# Patient Record
Sex: Male | Born: 1965 | Race: White | Hispanic: No | Marital: Married | Smoking: Current every day smoker
Health system: Southern US, Community
[De-identification: ages and names within clinical notes are randomized; demographics above are authoritative.]

## PROBLEM LIST (undated history)

## (undated) DIAGNOSIS — J439 Emphysema, unspecified: Secondary | ICD-10-CM

## (undated) DIAGNOSIS — K219 Gastro-esophageal reflux disease without esophagitis: Secondary | ICD-10-CM

## (undated) DIAGNOSIS — G44009 Cluster headache syndrome, unspecified, not intractable: Secondary | ICD-10-CM

## (undated) DIAGNOSIS — I10 Essential (primary) hypertension: Secondary | ICD-10-CM

## (undated) DIAGNOSIS — G2581 Restless legs syndrome: Secondary | ICD-10-CM

## (undated) DIAGNOSIS — F419 Anxiety disorder, unspecified: Secondary | ICD-10-CM

## (undated) DIAGNOSIS — M1990 Unspecified osteoarthritis, unspecified site: Secondary | ICD-10-CM

## (undated) DIAGNOSIS — J449 Chronic obstructive pulmonary disease, unspecified: Secondary | ICD-10-CM

## (undated) DIAGNOSIS — Z860100 Personal history of colon polyps, unspecified: Secondary | ICD-10-CM

## (undated) DIAGNOSIS — G479 Sleep disorder, unspecified: Secondary | ICD-10-CM

## (undated) DIAGNOSIS — H02409 Unspecified ptosis of unspecified eyelid: Secondary | ICD-10-CM

## (undated) DIAGNOSIS — E785 Hyperlipidemia, unspecified: Secondary | ICD-10-CM

## (undated) HISTORY — PX: TRIGGER FINGER RELEASE: SHX641

## (undated) HISTORY — DX: Personal history of colon polyps, unspecified: Z86.0100

## (undated) HISTORY — DX: Anxiety disorder, unspecified: F41.9

## (undated) HISTORY — DX: Essential (primary) hypertension: I10

## (undated) HISTORY — PX: POLYPECTOMY: SHX149

## (undated) HISTORY — DX: Gastro-esophageal reflux disease without esophagitis: K21.9

## (undated) HISTORY — DX: Emphysema, unspecified: J43.9

## (undated) HISTORY — DX: Personal history of colonic polyps: Z86.010

## (undated) HISTORY — PX: COLONOSCOPY: SHX174

## (undated) HISTORY — DX: Hyperlipidemia, unspecified: E78.5

## (undated) HISTORY — DX: Sleep disorder, unspecified: G47.9

## (undated) HISTORY — PX: KNEE ARTHROSCOPY: SUR90

## (undated) HISTORY — DX: Restless legs syndrome: G25.81

## (undated) HISTORY — DX: Chronic obstructive pulmonary disease, unspecified: J44.9

## (undated) HISTORY — DX: Unspecified ptosis of unspecified eyelid: H02.409

## (undated) HISTORY — DX: Unspecified osteoarthritis, unspecified site: M19.90

## (undated) HISTORY — DX: Cluster headache syndrome, unspecified, not intractable: G44.009

---
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
## 2000-06-04 ENCOUNTER — Emergency Department (HOSPITAL_COMMUNITY): Admission: EM | Admit: 2000-06-04 | Discharge: 2000-06-05 | Payer: Self-pay | Admitting: Emergency Medicine

## 2000-06-05 ENCOUNTER — Encounter: Payer: Self-pay | Admitting: Emergency Medicine

## 2003-04-16 ENCOUNTER — Emergency Department (HOSPITAL_COMMUNITY): Admission: EM | Admit: 2003-04-16 | Discharge: 2003-04-16 | Payer: Self-pay

## 2004-02-14 ENCOUNTER — Ambulatory Visit: Payer: Self-pay | Admitting: Internal Medicine

## 2004-02-14 ENCOUNTER — Inpatient Hospital Stay (HOSPITAL_COMMUNITY): Admission: EM | Admit: 2004-02-14 | Discharge: 2004-02-15 | Payer: Self-pay | Admitting: Emergency Medicine

## 2004-02-15 ENCOUNTER — Ambulatory Visit: Payer: Self-pay | Admitting: Internal Medicine

## 2005-06-11 ENCOUNTER — Emergency Department (HOSPITAL_COMMUNITY): Admission: EM | Admit: 2005-06-11 | Discharge: 2005-06-11 | Payer: Self-pay | Admitting: Emergency Medicine

## 2005-08-05 ENCOUNTER — Ambulatory Visit: Payer: Self-pay

## 2005-08-05 ENCOUNTER — Ambulatory Visit: Payer: Self-pay | Admitting: Cardiovascular Disease

## 2005-08-07 ENCOUNTER — Ambulatory Visit: Payer: Self-pay | Admitting: Cardiovascular Disease

## 2005-08-07 ENCOUNTER — Ambulatory Visit (HOSPITAL_COMMUNITY): Admission: RE | Admit: 2005-08-07 | Discharge: 2005-08-07 | Payer: Self-pay | Admitting: Cardiovascular Disease

## 2006-02-11 ENCOUNTER — Emergency Department (HOSPITAL_COMMUNITY): Admission: EM | Admit: 2006-02-11 | Discharge: 2006-02-11 | Payer: Self-pay | Admitting: Emergency Medicine

## 2006-07-17 ENCOUNTER — Emergency Department (HOSPITAL_COMMUNITY): Admission: EM | Admit: 2006-07-17 | Discharge: 2006-07-18 | Payer: Self-pay | Admitting: Emergency Medicine

## 2006-12-29 ENCOUNTER — Emergency Department (HOSPITAL_COMMUNITY): Admission: EM | Admit: 2006-12-29 | Discharge: 2006-12-29 | Payer: Self-pay | Admitting: Emergency Medicine

## 2007-02-18 ENCOUNTER — Encounter: Admission: RE | Admit: 2007-02-18 | Discharge: 2007-02-18 | Payer: Self-pay | Admitting: Family Medicine

## 2007-03-16 ENCOUNTER — Inpatient Hospital Stay (HOSPITAL_COMMUNITY): Admission: RE | Admit: 2007-03-16 | Discharge: 2007-03-18 | Payer: Self-pay | Admitting: Orthopaedic Surgery

## 2007-06-07 ENCOUNTER — Ambulatory Visit: Payer: Self-pay | Admitting: Family Medicine

## 2007-06-08 ENCOUNTER — Ambulatory Visit: Payer: Self-pay | Admitting: Internal Medicine

## 2007-06-10 ENCOUNTER — Ambulatory Visit: Payer: Self-pay | Admitting: Internal Medicine

## 2007-06-10 ENCOUNTER — Encounter: Payer: Self-pay | Admitting: Internal Medicine

## 2007-11-19 ENCOUNTER — Emergency Department (HOSPITAL_COMMUNITY): Admission: EM | Admit: 2007-11-19 | Discharge: 2007-11-19 | Payer: Self-pay | Admitting: Emergency Medicine

## 2008-12-21 ENCOUNTER — Emergency Department (HOSPITAL_COMMUNITY): Admission: EM | Admit: 2008-12-21 | Discharge: 2008-12-21 | Payer: Self-pay | Admitting: Emergency Medicine

## 2009-05-21 ENCOUNTER — Emergency Department (HOSPITAL_COMMUNITY): Admission: EM | Admit: 2009-05-21 | Discharge: 2009-05-21 | Payer: Self-pay | Admitting: Emergency Medicine

## 2009-11-22 ENCOUNTER — Telehealth (INDEPENDENT_AMBULATORY_CARE_PROVIDER_SITE_OTHER): Payer: Self-pay | Admitting: *Deleted

## 2009-11-27 ENCOUNTER — Ambulatory Visit: Payer: Self-pay | Admitting: Internal Medicine

## 2009-11-27 DIAGNOSIS — G2581 Restless legs syndrome: Secondary | ICD-10-CM

## 2009-11-27 DIAGNOSIS — G4733 Obstructive sleep apnea (adult) (pediatric): Secondary | ICD-10-CM

## 2009-11-27 DIAGNOSIS — R109 Unspecified abdominal pain: Secondary | ICD-10-CM | POA: Insufficient documentation

## 2009-11-27 DIAGNOSIS — I10 Essential (primary) hypertension: Secondary | ICD-10-CM | POA: Insufficient documentation

## 2009-11-27 DIAGNOSIS — J4489 Other specified chronic obstructive pulmonary disease: Secondary | ICD-10-CM | POA: Insufficient documentation

## 2009-11-27 DIAGNOSIS — Z86010 Personal history of colonic polyps: Secondary | ICD-10-CM

## 2009-11-27 DIAGNOSIS — J449 Chronic obstructive pulmonary disease, unspecified: Secondary | ICD-10-CM

## 2009-12-03 LAB — CONVERTED CEMR LAB
ALT: 26 units/L (ref 0–53)
AST: 18 units/L (ref 0–37)
Albumin: 4.3 g/dL (ref 3.5–5.2)
Alkaline Phosphatase: 80 units/L (ref 39–117)
BUN: 29 mg/dL — ABNORMAL HIGH (ref 6–23)
Basophils Relative: 0.8 % (ref 0.0–3.0)
Bilirubin, Direct: 0.1 mg/dL (ref 0.0–0.3)
CO2: 28 meq/L (ref 19–32)
Calcium: 9.7 mg/dL (ref 8.4–10.5)
Chloride: 98 meq/L (ref 96–112)
Creatinine, Ser: 1.3 mg/dL (ref 0.4–1.5)
Eosinophils Absolute: 0.2 10*3/uL (ref 0.0–0.7)
Eosinophils Relative: 2.1 % (ref 0.0–5.0)
GFR calc non Af Amer: 62.13 mL/min (ref >60)
H Pylori IgG: POSITIVE
HCT: 42.3 % (ref 39.0–52.0)
Hemoglobin: 14.7 g/dL (ref 13.0–17.0)
Lipase: 30 units/L (ref 11.0–59.0)
Lymphocytes Relative: 23.3 % (ref 12.0–46.0)
Lymphs Abs: 2.6 10*3/uL (ref 0.7–4.0)
MCV: 99.2 fL (ref 78.0–100.0)
Monocytes Absolute: 0.9 10*3/uL (ref 0.1–1.0)
Monocytes Relative: 8.3 % (ref 3.0–12.0)
Neutro Abs: 7.2 10*3/uL (ref 1.4–7.7)
Neutrophils Relative %: 65.5 % (ref 43.0–77.0)
Platelets: 291 10*3/uL (ref 150.0–400.0)
RBC: 4.27 M/uL (ref 4.22–5.81)
RDW: 13.2 % (ref 11.5–14.6)
Sodium: 137 meq/L (ref 135–145)
Total Protein: 7.3 g/dL (ref 6.0–8.3)
WBC: 11 10*3/uL — ABNORMAL HIGH (ref 4.5–10.5)

## 2009-12-13 ENCOUNTER — Ambulatory Visit: Payer: Self-pay | Admitting: Internal Medicine

## 2009-12-13 DIAGNOSIS — T7840XA Allergy, unspecified, initial encounter: Secondary | ICD-10-CM | POA: Insufficient documentation

## 2009-12-14 ENCOUNTER — Telehealth: Payer: Self-pay | Admitting: Internal Medicine

## 2009-12-21 ENCOUNTER — Telehealth (INDEPENDENT_AMBULATORY_CARE_PROVIDER_SITE_OTHER): Payer: Self-pay | Admitting: *Deleted

## 2009-12-31 ENCOUNTER — Telehealth (INDEPENDENT_AMBULATORY_CARE_PROVIDER_SITE_OTHER): Payer: Self-pay | Admitting: *Deleted

## 2010-01-01 ENCOUNTER — Telehealth (INDEPENDENT_AMBULATORY_CARE_PROVIDER_SITE_OTHER): Payer: Self-pay | Admitting: *Deleted

## 2010-04-17 ENCOUNTER — Ambulatory Visit
Admission: RE | Admit: 2010-04-17 | Discharge: 2010-04-17 | Payer: Self-pay | Source: Home / Self Care | Attending: Internal Medicine | Admitting: Internal Medicine

## 2010-04-17 ENCOUNTER — Encounter: Payer: Self-pay | Admitting: Internal Medicine

## 2010-04-17 DIAGNOSIS — J45909 Unspecified asthma, uncomplicated: Secondary | ICD-10-CM | POA: Insufficient documentation

## 2010-04-17 DIAGNOSIS — L509 Urticaria, unspecified: Secondary | ICD-10-CM | POA: Insufficient documentation

## 2010-04-17 DIAGNOSIS — M25529 Pain in unspecified elbow: Secondary | ICD-10-CM | POA: Insufficient documentation

## 2010-05-05 ENCOUNTER — Encounter: Payer: Self-pay | Admitting: *Deleted

## 2010-05-14 NOTE — Progress Notes (Signed)
Summary: QUESTION ABOUT APPT THIS AFTERNOON  Phone Note Call from Patient Call back at Work Phone [phone]   Caller: Patient Summary of Call: PATIENT HAS APPT LATE THIS AFTERNOON WITH DR HOPPER--HE SAYS THAT PREDNISONE IS HELPING WITH THE ITCHING AND SWELLING SYMPTOMS---HE DOES NOT HAVE ANY BREATHING PROBLEMS WITH THIS RASH  HE DOESNT KNOW WHETHER TO KEEP THIS APPOINTMENT TODAY OR NOT---PLEASE CALL [phone] Initial call taken by: Jerolyn Shin,  January 01, 2010 10:18 AM  Follow-up for Phone Call        called patient informed he still needs to keep office visit w/ hop...Marland KitchenMarland KitchenDoristine Devoid CMA  January 01, 2010 11:07 AM

## 2010-05-14 NOTE — Progress Notes (Signed)
Summary: allergic reaction  Phone Note Call from Patient Call back at Home Phone [phone]   Caller: Patient Summary of Call: spoke w/ patient says he is having an allergic reaction again says it got better after being seen on 12/13/09 and recently was doing some work outside this weekend and laid rag over face to shield from sunlight and then around lunch time his eyes where swollen and now has hives over body has been taken benadryl w/ some relief would like to have prescription for prednisone to get started on and also has appt scheduled to come in tomorrow afternoon.  hop pls advise.  [facility]  Initial call taken by: Doristine Devoid CMA,  December 31, 2009 10:10 AM  Follow-up for Phone Call        prednisone 20 mg two times a day #10. Also take Zantac 75 OTC 2 two times a day  Follow-up by: Marga Melnick MD,  December 31, 2009 1:16 PM  Additional Follow-up for Phone Call Additional follow up Details #1::        spoke w/ patient aware prescription sent to pharmacy and keep appt tomorrow.......Marland KitchenDoristine Devoid CMA  December 31, 2009 4:37 PM     New/Updated Medications: PREDNISONE 20 MG TABS (PREDNISONE) take two times a day Prescriptions: PREDNISONE 20 MG TABS (PREDNISONE) take two times a day  #10 x 0   Entered by:   Doristine Devoid CMA   Authorized by:   Marga Melnick MD   Signed by:   Doristine Devoid CMA on 12/31/2009   Method used:   Electronically to        [facility] (retail)       [address]       [place], [place]  [address]       Ph: [phone]       Fax: [phone]   RxID:   [id number]

## 2010-05-14 NOTE — Progress Notes (Signed)
Summary: Pt status (lmom 9/2, 9/6, 9/7)  ---- Converted from flag ---- ---- 12/13/2009 5:16 PM, Jose E. Paz MD wrote: please check on the patient ------------------------------  Left message for pt to call back.  Army Fossa CMA  December 14, 2009 1:34 PM  lmtcb.Marland KitchenMarland KitchenHarold Barban  December 18, 2009 11:41 AM   lmtcb.Marland KitchenMarland KitchenHarold Barban  December 19, 2009 10:20 AM    Unable to reach pt. Army Fossa CMA  December 21, 2009 9:04 AM

## 2010-05-14 NOTE — Progress Notes (Signed)
Summary: 9-9pt status  Phone Note Call from Patient Call back at Work Phone [phone]   Caller: Patient Summary of Call: pt still c/o itching at night on feet and hands. pt states that swelling and hive have resolved. pt denies any redness, fever or any other symptoms.  Pt uses [facility]...............Marland KitchenFelecia Deloach CMA  December 21, 2009 12:30 PM   Follow-up for Phone Call        should gradually resolve. No further prescriptions  at this time. Follow-up by: Nolon Rod. Paz MD,  December 21, 2009 1:44 PM  Additional Follow-up for Phone Call Additional follow up Details #1::        left message to call office................Marland KitchenFelecia Deloach CMA  December 21, 2009 2:42 PM

## 2010-05-14 NOTE — Progress Notes (Signed)
Summary: NEW PT  Phone Note Call from Patient   Caller: Spouse Summary of Call: WIFE CALLS AND STATES THAT [name] IS AN OLD PT OF DR. PAZ LAST OV WAS IN 2005. [name] IS A PT OF DR. HOPPER AND Peter Vang WOULD LIKE TO BE A NEW PT OF DR. HOPPER. PLEASE ADVISE. CAN BE CONTACTED AT [phone] Initial call taken by: Lavell Islam,  November 22, 2009 12:16 PM  Follow-up for Phone Call        Yes we can see any new patient that is 64 and under.  Follow-up by: Shonna Chock CMA,  November 22, 2009 1:15 PM  Additional Follow-up for Phone Call Additional follow up Details #1::        pt has an appt Tuesday 8/16 Additional Follow-up by: Lavell Islam,  November 22, 2009 3:02 PM
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
## 2010-05-16 NOTE — Assessment & Plan Note (Signed)
Summary: POSSIBLE INFECTION/DEBRIS OF ELBOW/KB   Vital Signs:  Patient profile:   45 year old male Weight:      205 pounds BMI:     29.52 Temp:     98.6 degrees F oral Pulse rate:   80 / minute Resp:     15 per minute BP sitting:   124 / 86  (left arm) Cuff size:   large  Vitals Entered By: Shonna Chock CMA (April 17, 2010 10:48 AM) CC: 1.) Patient with reoccuring rash x few months ( no active rash today), patient also with right elbow infection-patient thinks he may have gotten a peice of metal in arm at work. 2.) Discuss increase in strength for Alprazolanm, Lower Extremity Joint pain, Rash   CC:  1.) Patient with reoccuring rash x few months ( no active rash today), patient also with right elbow infection-patient thinks he may have gotten a peice of metal in arm at work. 2.) Discuss increase in strength for Alprazolanm, Lower Extremity Joint pain, and Rash.  History of Present Illness:      This is a 46 year old man who presents with Upper  Extremity Joint pain intermittently for 3 months   The patient denies swelling, redness, stiffness for >1 hr, decreased ROM, and weakness.  The pain is located in the R elbow.  The pain began suddenly and with no injury. He questions relationship to possible  foreign body such as metal shaving. The pain is described as sharp , non radiating  and positionally  related.It occurs intermittently with pressure on  elbow.  No evaluation to date  The patient denies the following symptoms: fever, rash, photosensitivity, eye symptoms, diarrhea, and dysuria.        The patient also presents with   recurrent rash.  The patient reports welts and itching, but denies pustules, blisters, and ulcers.  The rash is located on the upper posterior trunk & limbs diffusely.  The rash is worse with scratching.  Associated symptoms include facial swelling on 1 occasion only & of hands and feet another occasion. Rx: Benadryl helps. Note: he is on ACE-I .The patient denies the  following symptoms: tongue swelling, difficulty breathing, abdominal pain, nausea, vomiting, diarrhea, and arthralgias.    Allergies (verified): No Known Drug Allergies  Physical Exam  General:  in no acute distress; alert,appropriate and cooperative throughout examination Mouth:  Oral mucosa and oropharynx without lesions or exudates.  Teeth in good repair. Moderate pharyngeal erythema.   Lungs:  Normal respiratory effort, chest expands symmetrically. Lungs : scattered rhonchi  Heart:  Normal rate and regular rhythm. S1 and S2 normal without gallop, murmur, click, rub.S4 Abdomen:  Bowel sounds positive,abdomen soft and non-tender without masses, organomegaly or hernias noted. Pulses:  R and L radial pulses are full and equal bilaterally Extremities:  No clubbing, cyanosis, edema, or deformity noted with normal full range of motion of all joints.  Minor R  elbow tenosynovitis  findings with pronation. No pain even with hard percussion  Neurologic:  alert & oriented X3, strength normal in all extremities, and DTRs symmetrical and normal.   Skin:  Intact without suspicious lesions or rashes. Lipoma R forearm. Mild Dermatographia Cervical Nodes:  No lymphadenopathy noted Axillary Nodes:  No palpable lymphadenopathy; no epitrochlear LA   Impression & Recommendations:  Problem # 1:  ELBOW PAIN, RIGHT (ICD-719.42)  probabale localized nerve impingement   Orders: T-Elbow Rigt 2 View (73070TC)  Problem # 2:  URTICARIA (ICD-708.9)  Problem # 3:  REACTIVE AIRWAY DISEASE (ICD-493.90)  The following medications were removed from the medication list:    Prednisone 20 Mg Tabs (Prednisone) .Marland Kitchen... Take two times a day His updated medication list for this problem includes:    Proair Hfa 108 (90 Base) Mcg/act Aers (Albuterol sulfate) .Marland Kitchen... 1-2 puffs every 4 hours as needed  Complete Medication List: 1)  Hydrochlorothiazide 25 Mg Tabs (Hydrochlorothiazide) .Marland Kitchen.. 1 tablet once daily 2)  Ibuprofen  200 Mg Tabs (Ibuprofen) .... 600 mg daily, as needed 3)  Alprazolam 1 Mg Tabs (alprazolam)  .Marland Kitchen.. 1 at bedtime  as needed 4)  Ranitidine Hcl 150 Mg Tabs (Ranitidine hcl) .Marland Kitchen.. 1 two times a day pre meals 5)  Hydroxyzine Pamoate 25 Mg Caps (Hydroxyzine pamoate) .Marland Kitchen.. 1 every 4-6 hrs as needed for rash 6)  Amlodipine Besylate 5 Mg Tabs (Amlodipine besylate) .Marland Kitchen.. 1 once daily for bp 7)  Proair Hfa 108 (90 Base) Mcg/act Aers (Albuterol sulfate) .Marland Kitchen.. 1-2 puffs every 4 hours as needed  Other Orders: Tdap => 13yrs IM (16109) Admin 1st Vaccine (60454)  Patient Instructions: 1)  Go to Web MD for Hives (Urticaria) . 2)  Stop Smoking Tips: Choose a Quit date. Cut down before the Quit date. decide what you will do as a substitute when you feel the urge to smoke(gum,toothpick,exercise). Prescriptions: ALPRAZOLAM 1  MG TABS (ALPRAZOLAM) 1 at bedtime  as needed  #30 x 2   Entered and Authorized by:   Marga Melnick MD   Signed by:   Shonna Chock CMA on 04/17/2010   Method used:   Print then Give to Patient   RxID:   [id number] AMLODIPINE BESYLATE 5 MG TABS (AMLODIPINE BESYLATE) 1 once daily for BP  #30 x 5   Entered and Authorized by:   Marga Melnick MD   Signed by:   Marga Melnick MD on 04/17/2010   Method used:   Print then Give to Patient   RxID:   [id number] HYDROXYZINE PAMOATE 25 MG CAPS (HYDROXYZINE PAMOATE) 1 every 4-6 hrs as needed for rash  #30 x 1   Entered and Authorized by:   Marga Melnick MD   Signed by:   Marga Melnick MD on 04/17/2010   Method used:   Print then Give to Patient   RxID:   [id number] RANITIDINE HCL 150 MG TABS (RANITIDINE HCL) 1 two times a day pre meals  #60 x 2   Entered and Authorized by:   Marga Melnick MD   Signed by:   Marga Melnick MD on 04/17/2010   Method used:   Print then Give to Patient   RxID:   [id number]    Orders Added: 1)  Est. Patient Level IV [64332] 2)  T-Elbow Rigt 2 View [73070TC] 3)  Tdap => 52yrs IM [90715] 4)   Admin 1st Vaccine [95188]   Immunizations Administered:  Tetanus Vaccine:    Vaccine Type: Tdap    Site: right deltoid    Mfr: GlaxoSmithKline    Dose: 0.5 ml    Route: IM    Given by: Shonna Chock CMA    Exp. Date: 02/01/2012    Lot #: [id number]   Immunizations Administered:  Tetanus Vaccine:    Vaccine Type: Tdap    Site: right deltoid    Mfr: GlaxoSmithKline    Dose: 0.5 ml    Route: IM    Given by: Shonna Chock CMA    Exp. Date: 02/01/2012    Lot #: [id number]

## 2010-05-16 NOTE — Miscellaneous (Signed)
Summary: Flu/[facility]  [facility]   Imported By: Lanelle Bal 04/26/2010 11:09:45  _____________________________________________________________________  External Attachment:    Type:   Image     Comment:   External Document

## 2010-06-22 ENCOUNTER — Encounter: Payer: Self-pay | Admitting: Internal Medicine

## 2010-07-03 LAB — WOUND CULTURE: Culture: NO GROWTH

## 2010-07-17 ENCOUNTER — Other Ambulatory Visit: Payer: Self-pay | Admitting: Internal Medicine

## 2010-07-19 LAB — COMPREHENSIVE METABOLIC PANEL
Alkaline Phosphatase: 113 U/L (ref 39–117)
BUN: 19 mg/dL (ref 6–23)
CO2: 26 mEq/L (ref 19–32)
Chloride: 104 mEq/L (ref 96–112)
Creatinine, Ser: 1.1 mg/dL (ref 0.4–1.5)
GFR calc non Af Amer: >60 mL/min (ref >60)
Potassium: 4 mEq/L (ref 3.5–5.1)
Total Bilirubin: 0.7 mg/dL (ref 0.3–1.2)

## 2010-07-19 LAB — CBC
HCT: 42.7 % (ref 39.0–52.0)
Hemoglobin: 14.9 g/dL (ref 13.0–17.0)
MCV: 94.3 fL (ref 78.0–100.0)
RBC: 4.53 MIL/uL (ref 4.22–5.81)
WBC: 8 10*3/uL (ref 4.0–10.5)

## 2010-07-19 LAB — URINALYSIS, ROUTINE W REFLEX MICROSCOPIC
Glucose, UA: NEGATIVE mg/dL
Hgb urine dipstick: NEGATIVE
Specific Gravity, Urine: 1.024 (ref 1.005–1.030)
pH: 7 (ref 5.0–8.0)

## 2010-07-19 LAB — DIFFERENTIAL
Basophils Absolute: 0 10*3/uL (ref 0.0–0.1)
Basophils Relative: 0 % (ref 0–1)
Eosinophils Relative: 4 % (ref 0–5)
Lymphocytes Relative: 18 % (ref 12–46)
Monocytes Absolute: 0.7 10*3/uL (ref 0.1–1.0)
Neutro Abs: 5.5 10*3/uL (ref 1.7–7.7)
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
## 2010-08-30 NOTE — Discharge Summary (Signed)
NAME:  Peter Vang, Peter Vang NO.:  [id number]   MEDICAL RECORD NO.:  [id number]          PATIENT TYPE:  INP   LOCATION:  3735                         FACILITY:  [facility]   PHYSICIAN:  Titus Dubin. Alwyn Ren, M.D. [facility] OF BIRTH:  10/21/1965   DATE OF ADMISSION:  02/14/2004  DATE OF DISCHARGE:  02/15/2004                                 DISCHARGE SUMMARY   NOTATION:  I received a phone call from Amber on the 3700 floor, phone [phone], requesting a discharge for [name].  He had a nuclear  stress test.  I requested that she contact the cardiology P.A. for the  results.  She called Tereso Newcomer, P.A., who did document that the study was  negative.  By computer, I verified that the patient's cardiac enzymes were  negative.  I called the patient and related this information to him.   DISPOSITION/FOLLOWUP:  He is to be discharged, to follow up with Dr. Fenton Malling in five to seven days.   DISCHARGE INSTRUCTIONS:  The triggers for esophageal reflux were discussed.  These would include the aspirin family, including ibuprofen, Naproxen and  all non-steroidals.  Additionally it was recommended that he avoid alcohol,  peppermint, tobacco and caffeine (coffee, tea, Cola and chocolate).  It  would also be recommended that he not eat within two hours of going to bed.  He is to return to the emergency room should he have progression or  recurrence of symptoms with these interventions.   DISCHARGE MEDICATIONS:  It was recommended that he take Prilosec over-the-  counter before the morning meal.       [facility]/MEDQ  D:  02/15/2004  T:  02/16/2004  Job:  [id number]

## 2010-09-11 ENCOUNTER — Telehealth: Payer: Self-pay | Admitting: Internal Medicine

## 2010-09-11 NOTE — Telephone Encounter (Signed)
Patient wants refill for xanax -he is leaving for the beach Friday [phone] - would like to pick up before he leaves - [facility] - [address]

## 2010-09-12 MED ORDER — ALPRAZOLAM 1 MG PO TABS: ORAL_TABLET | ORAL | Status: DC

## 2010-09-12 NOTE — Telephone Encounter (Signed)
rx faxed to pharmacy

## 2010-10-28 ENCOUNTER — Other Ambulatory Visit: Payer: Self-pay | Admitting: Internal Medicine

## 2010-11-19 ENCOUNTER — Other Ambulatory Visit: Payer: Self-pay | Admitting: Internal Medicine

## 2010-11-19 MED ORDER — ALPRAZOLAM 1 MG PO TABS: ORAL_TABLET | ORAL | Status: DC

## 2010-11-19 MED ORDER — ALBUTEROL SULFATE HFA 108 (90 BASE) MCG/ACT IN AERS: 2.0000 | INHALATION_SPRAY | Freq: Four times a day (QID) | RESPIRATORY_TRACT | Status: DC | PRN

## 2010-11-19 NOTE — Telephone Encounter (Signed)
RX's sent to pharmacy 

## 2010-11-19 NOTE — Telephone Encounter (Signed)
Patient was given  sample of inhaler - he doesn't know the name - he wants rx called in - also refill for xanax - [facility] - [address]

## 2011-01-21 LAB — BASIC METABOLIC PANEL
BUN: 18
Chloride: 103
Creatinine, Ser: 1.32
GFR calc Af Amer: >60
GFR calc non Af Amer: 59 — ABNORMAL LOW

## 2011-01-21 LAB — CBC
MCV: 92.9
Platelets: 258
RBC: 4.34
WBC: 7.9

## 2011-01-23 LAB — POCT CARDIAC MARKERS
CKMB, poc: <1 — ABNORMAL LOW
CKMB, poc: <1 — ABNORMAL LOW
Myoglobin, poc: 41.1
Myoglobin, poc: 52.4
Operator id: 279831
Troponin i, poc: <0.05
Troponin i, poc: <0.05

## 2011-01-23 LAB — I-STAT 8, (EC8 V) (CONVERTED LAB)
Acid-Base Excess: 4 — ABNORMAL HIGH
Bicarbonate: 30.2 — ABNORMAL HIGH
HCT: 47
Operator id: 279831
TCO2: 32
pCO2, Ven: 50.9 — ABNORMAL HIGH
pH, Ven: 7.381 — ABNORMAL HIGH

## 2011-01-23 LAB — POCT I-STAT CREATININE: Operator id: 279831

## 2011-02-13 ENCOUNTER — Ambulatory Visit: Payer: Self-pay | Admitting: Internal Medicine

## 2011-02-14 ENCOUNTER — Telehealth: Payer: Self-pay | Admitting: Internal Medicine

## 2011-02-14 NOTE — Telephone Encounter (Signed)
Patient wants refill for inhaler - he thinks it is albuterol - [facility] [address]

## 2011-02-14 NOTE — Telephone Encounter (Signed)
LMOM to inform patient that he has active refills from 11/19/10 Rx sent to his pharmacy available [5 refills authorized].

## 2011-02-19 ENCOUNTER — Encounter: Payer: Self-pay | Admitting: Internal Medicine

## 2011-02-20 ENCOUNTER — Ambulatory Visit (INDEPENDENT_AMBULATORY_CARE_PROVIDER_SITE_OTHER): Payer: BC Managed Care – PPO | Admitting: Internal Medicine

## 2011-02-20 ENCOUNTER — Other Ambulatory Visit: Payer: Self-pay | Admitting: Internal Medicine

## 2011-02-20 ENCOUNTER — Encounter: Payer: Self-pay | Admitting: Internal Medicine

## 2011-02-20 VITALS — BP 146/98 | HR 92 | Temp 98.4°F | Wt 203.6 lb

## 2011-02-20 DIAGNOSIS — R1310 Dysphagia, unspecified: Secondary | ICD-10-CM

## 2011-02-20 DIAGNOSIS — M2550 Pain in unspecified joint: Secondary | ICD-10-CM

## 2011-02-20 DIAGNOSIS — K219 Gastro-esophageal reflux disease without esophagitis: Secondary | ICD-10-CM

## 2011-02-20 MED ORDER — TRAMADOL HCL 50 MG PO TABS: 50.0000 mg | ORAL_TABLET | Freq: Three times a day (TID) | ORAL | Status: DC | PRN

## 2011-02-20 MED ORDER — ALPRAZOLAM 1 MG PO TABS: ORAL_TABLET | ORAL | Status: DC

## 2011-02-20 MED ORDER — RANITIDINE HCL 150 MG PO TABS: 150.0000 mg | ORAL_TABLET | Freq: Two times a day (BID) | ORAL | Status: DC

## 2011-02-20 NOTE — Patient Instructions (Signed)
The triggers for dyspepsia or "heart burn"  include stress; the "aspirin family" ; alcohol; peppermint; and caffeine (coffee, tea, cola, and chocolate). The aspirin family would include aspirin and the nonsteroidal agents such as ibuprofen &  Naproxen. Tylenol would not cause reflux. If having dyspepsia ; food & drink should be avoided for @ least 2 hours before going to bed. Consider  [facility]'s smoking cessation program @ www.[facility].com or 539-041-0608.   Please think about quitting smoking. Review the risks we discussed. Please call 1-800-QUIT-NOW  for free smoking cessation counseling.

## 2011-02-20 NOTE — Telephone Encounter (Signed)
RX sent

## 2011-02-20 NOTE — Progress Notes (Signed)
  Subjective:    Patient ID: Peter Vang, male    DOB: 1965-09-22, 45 y.o.   MRN: [id number]  HPI Dysphagia: Location: upper chest  Onset: 4-5 months intermittently   Severity: pain up to 7 Quality: sharp  Duration: minutes  Better with: regurgitation  Worse with: ? meat Symptoms Nausea/Vomiting: no  Melena/BRBPR: no  Hematemesis: no   Wt loss: no  EtOH use: yes, minimal beer Caffeine: sweet tea  1/2 gallon / day & 1 cup coffee/ day Smoking : 1-1.5 ppd  NSAIDs/ASA: yes, Ibuprofen 600-800 mg 2-3 X / day for MS pain  Past Surgeries: colonoscopy for ? Pain ; polypectomy FH: negative for GI disease       Review of Systems     Objective:   Physical Exam General appearance is  good  nourishment; no acute distress or increased work of breathing is present.  No  lymphadenopathy about the head, neck, or axilla noted.   Eyes: No conjunctival inflammation or lid edema is present. There is no scleral icterus. Ptosis OS Oral exam: Dental hygiene is good; lips and gums are healthy appearing.There is marked uvular  erythema ; no exudate noted.    Heart:  Normal rate and regular rhythm. S1 and S2 normal without gallop, murmur, click, rub S 4  Lungs:Chest clear to auscultation; no wheezes, rhonchi,rales ,or rubs present.No increased work of breathing.   Abdomen: bowel sounds normal, soft and non-tender without masses, organomegaly or hernias noted.  No guarding or rebound    Extremities:  No cyanosis, edema, or clubbing  noted    Skin: Warm & dry w/o jaundice or tenting.          Assessment & Plan:  #1 dysphasia, recurrent. Pathophysiology discussed  #2 excess triggers of nonsteroidals, tobacco use  Plan: See orders and recommendations

## 2011-02-21 ENCOUNTER — Encounter: Payer: Self-pay | Admitting: Internal Medicine

## 2011-03-01 ENCOUNTER — Ambulatory Visit (INDEPENDENT_AMBULATORY_CARE_PROVIDER_SITE_OTHER): Payer: BC Managed Care – PPO | Admitting: Family Medicine

## 2011-03-01 ENCOUNTER — Encounter: Payer: Self-pay | Admitting: Family Medicine

## 2011-03-01 ENCOUNTER — Ambulatory Visit: Payer: BC Managed Care – PPO | Admitting: Family Medicine

## 2011-03-01 VITALS — BP 112/78 | HR 90 | Temp 98.5°F | Wt 206.8 lb

## 2011-03-01 DIAGNOSIS — J441 Chronic obstructive pulmonary disease with (acute) exacerbation: Secondary | ICD-10-CM | POA: Insufficient documentation

## 2011-03-01 DIAGNOSIS — R062 Wheezing: Secondary | ICD-10-CM

## 2011-03-01 MED ORDER — IPRATROPIUM BROMIDE 0.02 % IN SOLN
0.5000 mg | Freq: Once | RESPIRATORY_TRACT | Status: AC
  Administered 2011-03-01: 0.5 mg via RESPIRATORY_TRACT

## 2011-03-01 MED ORDER — PREDNISONE 50 MG PO TABS: 50.0000 mg | ORAL_TABLET | Freq: Every day | ORAL | Status: DC

## 2011-03-01 MED ORDER — DOXYCYCLINE HYCLATE 100 MG PO CAPS: 100.0000 mg | ORAL_CAPSULE | Freq: Two times a day (BID) | ORAL | Status: AC

## 2011-03-01 MED ORDER — ALBUTEROL SULFATE (2.5 MG/3ML) 0.083% IN NEBU
2.5000 mg | INHALATION_SOLUTION | Freq: Once | RESPIRATORY_TRACT | Status: AC
  Administered 2011-03-01: 2.5 mg via RESPIRATORY_TRACT

## 2011-03-01 MED ORDER — HYDROCODONE-HOMATROPINE 5-1.5 MG/5ML PO SYRP: 5.0000 mL | ORAL_SOLUTION | Freq: Every evening | ORAL | Status: AC | PRN

## 2011-03-01 NOTE — Assessment & Plan Note (Signed)
Treated with alb/atrovent with subjective improvement in breathing and improved air movement. Sent home with prednisone course, doxy, and scheduled albuterol. To return for breathing f/u with PCP ?spirometry discussed importance of smoking cessation. No evidence of PNA today.

## 2011-03-01 NOTE — Progress Notes (Signed)
  Subjective:    Patient ID: Peter Vang, male    DOB: 1965/06/22, 45 y.o.   MRN: [id number]  HPI CC: cold?  45 yo with h/o COPD presents with 1 wk h/o significant congestion, HA, coughing up thick green mucous, body aches.  More SOB than normal.  More coughing than normal.  More productive sputum than normal.  At night not sleeping well.  Wheezing at night.  Has tried mucinex D, tramadol for aches.  Has been on steroids before.  Takes albuterol as needed.  Has had spirometry.  No fevers/chills, abd pain, n/v/d, rashes, appetite ok.  No ear pain or tooth pain.  Significant other sick last week.  Smoker - trying to quit.  Averages 1/2 ppd.  Using patches.  Previously 1-2 ppd.    Today O2 90% RA.  1 1/2 wks ago had O2 at 88%, no sxs or cough.  Review of Systems Per HPI    Objective:   Physical Exam  Vitals reviewed. Constitutional: He appears well-developed and well-nourished. No distress.  HENT:  Head: Normocephalic and atraumatic.  Right Ear: Hearing, tympanic membrane, external ear and ear canal normal.  Left Ear: Hearing, tympanic membrane, external ear and ear canal normal.  Nose: Nose normal. No mucosal edema or rhinorrhea. Right sinus exhibits no maxillary sinus tenderness and no frontal sinus tenderness. Left sinus exhibits no maxillary sinus tenderness and no frontal sinus tenderness.  Mouth/Throat: Uvula is midline and mucous membranes are normal. Posterior oropharyngeal erythema present. No oropharyngeal exudate, posterior oropharyngeal edema or tonsillar abscesses.  Eyes: Conjunctivae and EOM are normal. Pupils are equal, round, and reactive to light. No scleral icterus.  Neck: Normal range of motion. Neck supple. No thyromegaly present.  Cardiovascular: Normal rate, regular rhythm, normal heart sounds and intact distal pulses.   No murmur heard. Pulmonary/Chest: Effort normal. No respiratory distress. He has wheezes. He has no rales.       Coarse throughout RLL  rhonchi Coughing with end exp wheeze  Musculoskeletal: He exhibits no edema.  Lymphadenopathy:    He has no cervical adenopathy.  Skin: Skin is warm and dry. No rash noted.  Psychiatric: He has a normal mood and affect.  after breathing treatment (alb/atrovent), improved air movement.  minimal wheezing.  no rales.   Assessment & Plan:

## 2011-03-01 NOTE — Patient Instructions (Addendum)
I think you have COPD exacerbation. Treat with steroid course and antibiotics. Use albuterol regularly for the next 3-4 days (2 puffs every 4-6 hours) then just as needed. Breathing treatment today. Best thing you can do is quit smoking - good job with cessation up to now. Please return to see Dr. Alwyn Ren in 3-4 weeks for breathing follow up, sooner if not improving as expected. If worsening - fever >101.5, worsening cough, shortness of breath, please seek urgent care.

## 2011-03-03 ENCOUNTER — Ambulatory Visit: Payer: BC Managed Care – PPO | Admitting: Internal Medicine

## 2011-03-19 ENCOUNTER — Ambulatory Visit: Payer: BC Managed Care – PPO | Admitting: Internal Medicine

## 2011-04-04 ENCOUNTER — Ambulatory Visit (INDEPENDENT_AMBULATORY_CARE_PROVIDER_SITE_OTHER): Payer: BC Managed Care – PPO | Admitting: Internal Medicine

## 2011-04-04 ENCOUNTER — Other Ambulatory Visit: Payer: Self-pay | Admitting: Internal Medicine

## 2011-04-04 ENCOUNTER — Encounter: Payer: Self-pay | Admitting: Internal Medicine

## 2011-04-04 VITALS — BP 132/64 | HR 92 | Ht 68.0 in | Wt 202.0 lb

## 2011-04-04 DIAGNOSIS — Z86010 Personal history of colonic polyps: Secondary | ICD-10-CM

## 2011-04-04 DIAGNOSIS — R1310 Dysphagia, unspecified: Secondary | ICD-10-CM

## 2011-04-04 DIAGNOSIS — R042 Hemoptysis: Secondary | ICD-10-CM

## 2011-04-04 DIAGNOSIS — R1314 Dysphagia, pharyngoesophageal phase: Secondary | ICD-10-CM

## 2011-04-04 DIAGNOSIS — G479 Sleep disorder, unspecified: Secondary | ICD-10-CM

## 2011-04-04 MED ORDER — ESOMEPRAZOLE MAGNESIUM 40 MG PO CPDR: 40.0000 mg | DELAYED_RELEASE_CAPSULE | Freq: Every day | ORAL | Status: DC
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
## 2011-04-04 NOTE — Patient Instructions (Addendum)
You have been scheduled for an Endoscopy with separate instructions given. Please schedule an appointment with [name] at [phone], about your sleep problems. Your Ranitidine has bee discontinued. Start Nexium 40 mg 1 capsule every morning 30 minutes before breakfast. You have been given a GERD Diet to read and follow. Please reduce your Caffeine intake and try to lose 10 lbs.

## 2011-04-09 ENCOUNTER — Encounter: Payer: Self-pay | Admitting: Internal Medicine

## 2011-04-09 ENCOUNTER — Other Ambulatory Visit: Payer: Self-pay | Admitting: Internal Medicine

## 2011-04-16 ENCOUNTER — Other Ambulatory Visit: Payer: Self-pay | Admitting: Internal Medicine

## 2011-04-17 ENCOUNTER — Other Ambulatory Visit: Payer: Self-pay | Admitting: Internal Medicine

## 2011-04-17 NOTE — Telephone Encounter (Signed)
RX sent

## 2011-04-17 NOTE — Telephone Encounter (Signed)
Please advise 

## 2011-04-17 NOTE — Telephone Encounter (Signed)
OK X1 

## 2011-04-17 NOTE — Telephone Encounter (Signed)
Pt is calling about refill. Wants this done today because he is completely out and this is the only thing that helps him sleep.

## 2011-04-18 ENCOUNTER — Telehealth: Payer: Self-pay | Admitting: Internal Medicine

## 2011-04-18 NOTE — Telephone Encounter (Signed)
No charge. 

## 2011-04-18 NOTE — Telephone Encounter (Signed)
Rx sent 

## 2011-04-18 NOTE — Telephone Encounter (Signed)
Per office note patient was to return to primary care MD for work up.  Dr Leone Payor did tell him to call and schedule an appt for sleep apnea.  I have asked the wife to schedule him an appt with his primary care MD.

## 2011-04-21 ENCOUNTER — Encounter: Payer: BC Managed Care – PPO | Admitting: Internal Medicine

## 2011-04-22 ENCOUNTER — Telehealth: Payer: Self-pay | Admitting: *Deleted

## 2011-04-22 NOTE — Telephone Encounter (Signed)
Called and spoke with pt's wife.  Wife scheduled pt for a sleep consult on 05/12/11 at 11:30

## 2011-04-22 NOTE — Telephone Encounter (Signed)
Message copied by Salli Quarry on Tue Apr 22, 2011 12:12 PM ------      Message from: [place], Maree Krabbe      Created: Fri Apr 04, 2011  6:12 PM       Aundra Millet, received a note from [employer].  He asked pt to call and set up apptm for me to discuss sleep issues with him.  I think I only see him for copd.  If that is the case, make sure this is a consult and in slot.  Thanks.

## 2011-05-12 ENCOUNTER — Ambulatory Visit (AMBULATORY_SURGERY_CENTER): Payer: BC Managed Care – PPO | Admitting: Internal Medicine

## 2011-05-12 ENCOUNTER — Encounter: Payer: Self-pay | Admitting: Pulmonary Disease

## 2011-05-12 ENCOUNTER — Encounter: Payer: Self-pay | Admitting: Internal Medicine

## 2011-05-12 ENCOUNTER — Ambulatory Visit (INDEPENDENT_AMBULATORY_CARE_PROVIDER_SITE_OTHER): Payer: BC Managed Care – PPO | Admitting: Pulmonary Disease

## 2011-05-12 VITALS — BP 140/92 | HR 88 | Temp 98.5°F | Ht 68.0 in | Wt 194.6 lb

## 2011-05-12 VITALS — BP 132/95 | HR 81 | Temp 96.1°F | Resp 18 | Ht 68.0 in | Wt 202.0 lb

## 2011-05-12 DIAGNOSIS — R1314 Dysphagia, pharyngoesophageal phase: Secondary | ICD-10-CM

## 2011-05-12 DIAGNOSIS — K219 Gastro-esophageal reflux disease without esophagitis: Secondary | ICD-10-CM

## 2011-05-12 DIAGNOSIS — G4700 Insomnia, unspecified: Secondary | ICD-10-CM

## 2011-05-12 DIAGNOSIS — R042 Hemoptysis: Secondary | ICD-10-CM

## 2011-05-12 MED ORDER — OMEPRAZOLE 20 MG PO CPDR: 20.0000 mg | DELAYED_RELEASE_CAPSULE | Freq: Every day | ORAL | Status: DC

## 2011-05-12 MED ORDER — ROPINIROLE HCL 0.5 MG PO TABS: ORAL_TABLET | ORAL | Status: DC

## 2011-05-12 MED ORDER — SODIUM CHLORIDE 0.9 % IV SOLN: 500.0000 mL | INTRAVENOUS | Status: DC

## 2011-05-12 NOTE — Patient Instructions (Signed)
Will try requip 0.5mg  one after supper each night for 3 nights, then increase to 2 after dinner Take melatonin 3mg  3-4 hrs before bedtime each night. Spend before going to bed doing something that relaxes you No tv or reading in bed, just sleeping If you cannot initiate sleep in , leave bedroom and read or watch tv until you are able to get sleepy again.  No eating or drinking, no computer at night.  Do this as many times as necessary during the night until you fall asleep or time to go to work No caffeine after 10am.  Drink decaffeinated tea. No napping during the day. You may have to have your back evaluated further if chronic pain continues.  This can be a big issue with sleep disruption. Please call me in 3 weeks with how things are going.

## 2011-05-12 NOTE — Op Note (Addendum)
[facility] [address] [place], [place]  [address]  ENDOSCOPY PROCEDURE REPORT  PATIENT:  Peter, Vang  MR#:  [id number] BIRTHDATE:  1965-07-03, 45 yrs. old  GENDER:  male  ENDOSCOPIST:  Iva Boop, MD, [facility]  PROCEDURE DATE:  05/12/2011 PROCEDURE:  EGD, diagnostic 43235, Maloney Dilation of Esophagus ASA CLASS:  Class II INDICATIONS:  dysphagia 1+ years of intermittent solid food dysphagia  MEDICATIONS:   These medications were titrated to patient response per physician's verbal order, Benadryl 12.5 mg IV, Versed 8 mg IV, Fentanyl 75 mcg IV TOPICAL ANESTHETIC:  Cetacaine Spray  DESCRIPTION OF PROCEDURE:   After the risks benefits and alternatives of the procedure were thoroughly explained, informed consent was obtained.  The LB GIF-H180 [id number] endoscope was introduced through the mouth and advanced to the second portion of the duodenum, without limitations.  The instrument was slowly withdrawn as the mucosa was fully examined. <<PROCEDUREIMAGES>>  An erosion was found in the distal esophagus. Few mm area of erythema consistent with a healing erosion. Z-line below and distinct at 40 cm.  Otherwise the examination was normal. Retroflexed views revealed no abnormalities.    The scope was then withdrawn from the patient, a 29 [place] Maloney dilator passed with mild-moderate resistance and no heme,  and the procedure completed.  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 REVISED:  05/12/2011 03:30 PM eSIGNED:   Iva Boop at 05/12/2011 03:30 PM  Gerome Sam, [phone]

## 2011-05-12 NOTE — Progress Notes (Signed)
Subjective:    Patient ID: Peter Vang, male    DOB: 01-15-1966, 46 y.o.   MRN: [id number]  HPI The patient is a 46 year old male who I've been asked to see for long-standing sleeping issues.  The patient states that he has had a problem with sleep onset and maintenance for at least 10 years, and is getting worse.  He typically will go to bed between 10 and 11 at night, and will usually watch television and had before trying to sleep.  He states that he may go to sleep within one hour, but usually it is 2 or more hours.  He has even stayed up all night without sleeping.  He typically will toss and turn, and states that he is unable to "wind down".  He will sometimes get up and go to the family room, and sometimes will get on a computer.  He is a minimal snorer according to the wife, and does not have any abnormal breathing pattern during sleep.  The patient does describe severe pain in his lower extremities like cramping, and has very severe leg kicking during the night according to the wife.  The leg discomfort does not occur during the day, and really does not start until he lies down in bed.  It does not get better with movement such as walking.  The patient also has severe chronic low back pain, and it is very painful at night.  He feels this is an issue with his sleep as well.  He has never been tried on a medication for possible restless legs syndrome.  The patient will sometimes take Xanax at bedtime, but has been treated with other sleeping medications in the past that actually makes his leg discomfort worse.  He gets up at 5:30 AM to start his day, and does not feel rested.  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  His wife has not noted sleepiness watching television in the evening, and the patient denies any sleepiness with driving.  His weight has actually gone down over the last few years.  The patient  drinks one cup of coffee in the morning, and then will drink 2-3 glasses of caffeinated tea a day.  He also will drink a few alcoholic beverages a few days a week.  He denies napping during the day.  His Epworth score today is only 2.   Review of Systems  Constitutional: Positive for appetite change. Negative for fever and unexpected weight change.  HENT: Positive for sore throat, trouble swallowing and dental problem. Negative for ear pain, nosebleeds, congestion, rhinorrhea, sneezing, postnasal drip and sinus pressure.   Eyes: Negative for redness and itching.  Respiratory: Positive for cough and shortness of breath. Negative for chest tightness and wheezing.   Cardiovascular: Positive for chest pain. Negative for palpitations and leg swelling.  Gastrointestinal: Negative for nausea and vomiting.  Genitourinary: Negative for dysuria.  Musculoskeletal: Positive for joint swelling.  Skin: Negative for rash.  Neurological: Negative for headaches.  Hematological: Does not bruise/bleed easily.  Psychiatric/Behavioral: Negative for dysphoric mood. The patient is nervous/anxious.        Objective:   Physical Exam Constitutional:  Well developed, no acute distress  HENT:  Nares patent without discharge  Oropharynx without exudate, palate and uvula are normal  Eyes:  Perrla, eomi, no scleral icterus  Neck:  No JVD, no TMG  Cardiovascular:  Normal rate, regular rhythm, no rubs or gallops.  No murmurs        Intact distal pulses  Pulmonary :  Normal breath sounds, no stridor or respiratory distress   No rales, rhonchi, or wheezing  Abdominal:  Soft, nondistended, bowel sounds present.  No tenderness noted.   Musculoskeletal:  No lower extremity edema noted.  Lymph Nodes:  No cervical lymphadenopathy noted  Skin:  No cyanosis noted  Neurologic:  Appears sleepy, appropriate, moves all 4 extremities without obvious deficit.         Assessment & Plan:

## 2011-05-12 NOTE — Assessment & Plan Note (Signed)
The patient has significant issues with sleep onset and maintenance for many years, and feels that it is worsening.  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  I questioned whether his leg issues are really related to RLS, since a lot of his history does not fit with this disorder.  He does have a lot of chronic back pain, and when combined with his leg symptoms, I wonder if this is more neuropathic pain.  I will try him on a dopamine agonist to see if he has a response, but if he does not, I would recommend a more aggressive evaluation of his back and leg issues.  I have also reviewed with him various environmental factors that can disrupt sleep, and asked him to assess this in his own sleeping environment.  In terms of treatment for his insomnia, I have explained that medications are rarely the answer.  I have reviewed good sleep hygiene with him, along with stimulus control therapy to see if this will help.  Ultimately, he may need a referral to behavioral specialist to consider cognitive behavioral therapy.

## 2011-05-12 NOTE — Patient Instructions (Addendum)
There was one spot of inflammation in the esophagus - thought due to acid reflux. The exam was otherwise ok. I dilated the esophagus because of your history of swallowing problems. Please follow the special diet today and tomorrow (in other instructions). I have prescribed omeprazole (generic Prilosec) to be taken every day as I think you have GERD and believe this will prevent further swallowing problems.  Please also try to quit smoking. It increases GERD and other problems. One of your biggest health concerns is your smoking.  This increases your risk for most cancers and serious cardiovascular diseases such as strokes, heart attacks.  You should try your best to stop.  If you need assistance, please contact your PCP or Smoking Cessation Class at [facility] [phone]) or [facility] Quit-Line (1-800-QUIT-NOW).  Iva Boop, MD, [facility]

## 2011-05-13 ENCOUNTER — Telehealth: Payer: Self-pay

## 2011-05-13 NOTE — Telephone Encounter (Signed)
  Follow up Call-  Call back number 05/12/2011  Post procedure Call Back phone  # [phone]  Permission to leave phone message Yes     Patient questions:  Do you have a fever, pain , or abdominal swelling? no Pain Score  0 *  Have you tolerated food without any problems? yes  Have you been able to return to your normal activities? yes  Do you have any questions about your discharge instructions: Diet   no Medications  no Follow up visit  no  Do you have questions or concerns about your Care? no  Actions: * If pain score is 4 or above: No action needed, pain <4.

## 2011-05-23 ENCOUNTER — Other Ambulatory Visit: Payer: Self-pay | Admitting: Internal Medicine

## 2011-06-11 ENCOUNTER — Other Ambulatory Visit: Payer: Self-pay | Admitting: Pulmonary Disease

## 2011-06-13 ENCOUNTER — Other Ambulatory Visit: Payer: Self-pay | Admitting: Pulmonary Disease

## 2011-06-16 ENCOUNTER — Telehealth: Payer: Self-pay | Admitting: Pulmonary Disease

## 2011-06-16 NOTE — Telephone Encounter (Signed)
LMOM for pt's wife TCB.  Pt was last seen by [facility] on 05/12/11 and tried on Requip 0.5mg  and was to call in 3 weeks to give [place] an update which he never did.  Need to know how he is doing on the requip.

## 2011-06-17 MED ORDER — ROPINIROLE HCL 0.5 MG PO TABS: ORAL_TABLET | ORAL | Status: DC

## 2011-06-17 NOTE — Telephone Encounter (Signed)
Spouse returned call. [phone] (work) . Me

## 2011-06-17 NOTE — Telephone Encounter (Signed)
PT's spouse reports that pt's leg cramps were much better on Requip.  He has been out of it for 2 days and leg cramps have returned.  Pt has not seen much difference in sleep pattern though.  Still only averaging 4 hrs/night.  Wakes up around 3-4 am and can't go back to sleep.  Pt is taking 2 Requip after dinner and also taking Melatonin as directed by Dr Shelle Iron.  Is it ok to refill Requip?  Please advise.

## 2011-06-17 NOTE — Telephone Encounter (Signed)
Called, spoke with pt's wife, Foye Clock.  I informed her of below per [facility].  She verbalized understanding of this and aware requip rx will be sent to [facility] on [address].  They will call back if anything further is needed prior to f/u.

## 2011-06-17 NOTE — Telephone Encounter (Signed)
Ok to refill with 11 fills. He needs to followup with me in one year if his leg jerks are going well. Regarding his awakening at night, he needs to remember to not stay in bed if he cannot get back to sleep within .  Can return to the bedroom once he gets sleepy enough to try again.

## 2011-06-17 NOTE — Telephone Encounter (Signed)
Spouse called back again. "hoping" that dr clance will get this rx in today asap. She hates to see her husband having so much leg pain. Also wanted to know if this rx could be written for a longer time so that they don't have to keep requesting this.

## 2011-06-18 ENCOUNTER — Telehealth: Payer: Self-pay | Admitting: Internal Medicine

## 2011-06-18 MED ORDER — ALBUTEROL SULFATE HFA 108 (90 BASE) MCG/ACT IN AERS: 2.0000 | INHALATION_SPRAY | Freq: Four times a day (QID) | RESPIRATORY_TRACT | Status: DC | PRN

## 2011-06-18 NOTE — Telephone Encounter (Signed)
Prescription sent to pharmacy.

## 2011-06-18 NOTE — Telephone Encounter (Signed)
Patient called stating he needs Dr. Alwyn Ren to call him in the albuterol inhaler he has used in the past. Patient states he does not call often and he needs this taken care of immediately. [facility] on [address].

## 2011-07-09 ENCOUNTER — Other Ambulatory Visit: Payer: Self-pay | Admitting: Internal Medicine

## 2011-09-02 ENCOUNTER — Other Ambulatory Visit: Payer: Self-pay | Admitting: Internal Medicine

## 2011-09-02 NOTE — Telephone Encounter (Signed)
Rx sent 

## 2011-10-27 ENCOUNTER — Other Ambulatory Visit: Payer: Self-pay | Admitting: Internal Medicine

## 2011-12-23 ENCOUNTER — Other Ambulatory Visit: Payer: Self-pay | Admitting: Internal Medicine

## 2011-12-24 NOTE — Telephone Encounter (Signed)
Last OV 02-20-11, last filled 10-27-11 #30 1

## 2011-12-24 NOTE — Telephone Encounter (Signed)
OK X 1; no additional refills w/o OV . Last OV 11/12

## 2011-12-26 ENCOUNTER — Other Ambulatory Visit: Payer: Self-pay | Admitting: Internal Medicine

## 2011-12-26 MED ORDER — ALPRAZOLAM 1 MG PO TABS: ORAL_TABLET | ORAL | Status: DC

## 2011-12-26 NOTE — Telephone Encounter (Signed)
RX called in .

## 2011-12-26 NOTE — Telephone Encounter (Signed)
Pt states that sleep med is not at pharm. He is very concern that he gets this presc before weekend, amym

## 2012-01-02 ENCOUNTER — Other Ambulatory Visit: Payer: Self-pay | Admitting: Internal Medicine

## 2012-01-26 ENCOUNTER — Other Ambulatory Visit: Payer: Self-pay | Admitting: Internal Medicine

## 2012-01-26 NOTE — Telephone Encounter (Signed)
Patient will need to schedule a CPX prior to additional refills

## 2012-02-24 ENCOUNTER — Other Ambulatory Visit: Payer: Self-pay | Admitting: Internal Medicine

## 2012-02-24 NOTE — Telephone Encounter (Signed)
OV 02/20/11, labs 11/27/09. Last filled 01/26/12 # 30 no refills  Plz advise   MW  [place] plz schedule pt appt     MW

## 2012-02-24 NOTE — Telephone Encounter (Signed)
Called pt to scheduled cpe-pt scheduled for 1.31.14 1:30pm  NOTE pt stated he needs his medicine called in today he has been getting this for 30-years and he can not sleep without it. Patient was screaming at me and I advised him I would scheduled the CPE and let [place] know he has made his appt but ultimately it is up to the dr., to approve RX but someone would call him back to let him know if medsd have been sent, please call on cell as home# was disconnected

## 2012-02-24 NOTE — Telephone Encounter (Signed)
OK # 30 qd prn

## 2012-02-24 NOTE — Telephone Encounter (Signed)
RX called in. Patient aware.  

## 2012-02-25 NOTE — Telephone Encounter (Signed)
Rx request refused already done.      MW

## 2012-03-29 ENCOUNTER — Other Ambulatory Visit: Payer: Self-pay | Admitting: Internal Medicine

## 2012-03-30 ENCOUNTER — Other Ambulatory Visit: Payer: Self-pay | Admitting: Internal Medicine

## 2012-04-28 ENCOUNTER — Other Ambulatory Visit: Payer: Self-pay | Admitting: Internal Medicine

## 2012-04-29 NOTE — Telephone Encounter (Signed)
Patient aware Controlled Substance Contract to be sign and rx to be picked up   

## 2012-05-14 ENCOUNTER — Encounter: Payer: BC Managed Care – PPO | Admitting: Internal Medicine

## 2012-05-28 ENCOUNTER — Other Ambulatory Visit: Payer: Self-pay | Admitting: Internal Medicine

## 2012-05-31 ENCOUNTER — Other Ambulatory Visit: Payer: Self-pay | Admitting: Internal Medicine

## 2012-05-31 MED ORDER — AMLODIPINE BESYLATE 5 MG PO TABS: 5.0000 mg | ORAL_TABLET | Freq: Every day | ORAL | Status: DC

## 2012-05-31 NOTE — Telephone Encounter (Signed)
Last OV 02-20-11, last 02-26-13  #30

## 2012-05-31 NOTE — Telephone Encounter (Signed)
Rx sent 

## 2012-05-31 NOTE — Telephone Encounter (Signed)
OK X1 but additional refills require office visit to update medical history.Last seen 2012

## 2012-06-10 ENCOUNTER — Encounter: Payer: Self-pay | Admitting: Internal Medicine

## 2012-06-25 ENCOUNTER — Other Ambulatory Visit: Payer: Self-pay | Admitting: Internal Medicine

## 2012-06-25 NOTE — Telephone Encounter (Signed)
Pending appointment 07/2012

## 2012-07-27 ENCOUNTER — Other Ambulatory Visit: Payer: Self-pay | Admitting: Internal Medicine

## 2012-07-27 NOTE — Telephone Encounter (Signed)
Unfortunately this cannot be refilled w/o follow up appointment. Appointment in January was canceled; he has not been seen since 2012. This is guideline of the [place] medical board

## 2012-07-27 NOTE — Telephone Encounter (Signed)
Last appointment 2012, **patient had appointment in January and canceled, pending appointment 08/09/2012. Please advise if ok to fill

## 2012-07-29 ENCOUNTER — Other Ambulatory Visit: Payer: Self-pay | Admitting: Internal Medicine

## 2012-07-30 NOTE — Telephone Encounter (Signed)
Refill done.  

## 2012-08-09 ENCOUNTER — Ambulatory Visit (INDEPENDENT_AMBULATORY_CARE_PROVIDER_SITE_OTHER): Payer: Private Health Insurance - Indemnity | Admitting: Internal Medicine

## 2012-08-09 ENCOUNTER — Encounter: Payer: Self-pay | Admitting: Internal Medicine

## 2012-08-09 VITALS — BP 128/80 | HR 104 | Resp 14 | Ht 69.03 in | Wt 191.0 lb

## 2012-08-09 DIAGNOSIS — J4489 Other specified chronic obstructive pulmonary disease: Secondary | ICD-10-CM

## 2012-08-09 DIAGNOSIS — J449 Chronic obstructive pulmonary disease, unspecified: Secondary | ICD-10-CM

## 2012-08-09 DIAGNOSIS — I10 Essential (primary) hypertension: Secondary | ICD-10-CM

## 2012-08-09 DIAGNOSIS — G2581 Restless legs syndrome: Secondary | ICD-10-CM

## 2012-08-09 DIAGNOSIS — F17200 Nicotine dependence, unspecified, uncomplicated: Secondary | ICD-10-CM

## 2012-08-09 DIAGNOSIS — G479 Sleep disorder, unspecified: Secondary | ICD-10-CM

## 2012-08-09 MED ORDER — CLONAZEPAM 0.5 MG PO TABS: ORAL_TABLET | ORAL | Status: DC

## 2012-08-09 NOTE — Progress Notes (Signed)
Subjective:    Patient ID: Peter Vang, male    DOB: 04/23/1965, 47 y.o.   MRN: [id number]  HPI    He was scheduled for an annual examination; but his work schedule necessitated him ing  to work. He requested that a "physical" be deferred. This information was provided after I completed all but the genitourinary component of the physical.  His past medical history, family history, social history was reviewed and updated in the chart  I reviewed his prior lab results with him;these were in 2011. I recommended that these be updated as there is no active lipid assessment. Again he wanted to defer this.       Review of Systems   His chief concern is a chronic sleep issue for which she's been taking Xanax. The alprazolam is no longer effective.This is in the context of restless leg syndrome as well as the inability to "shut his mind off". He had seen a sleep specialists but no sleep study was performed. He expressed dissatisfaction with the sleep evaluation; he stated "I was told to soak my legs are hot tub if I couldn't sleep". That evaluation was not reviewed at this visit. He states that there is no description of excess snoring or apnea according to his wife.   He continues to smoke; the cardiovascular risk were reviewed with him. He expressed no interest in smoking cessation options.   Prior lab records were reviewed; he has never demonstrated anemia.   He is not monitoring his blood pressure at home; he is compliant with his calcium channel blocker. He denies chest pain, palpitations, edema, or paroxysmal nocturnal dyspnea. He will have some exertional dyspnea.  Is seeing Dr. Ethelene Hal for diffuse arthralgias; this involved in shoulders hands and knees especially. He describes stiffness in his hands mainly in the afternoons rather in early morning.      Objective:   Physical Exam  Gen.: Tanned and well-nourished in appearance. Alert, appropriate and cooperative throughout  exam.  Head: Normocephalic without obvious abnormalities; goatee  Eyes: No corneal or conjunctival inflammation noted. Pupils equal round reactive to light and accommodation. Fundal exam is limited in OS. Extraocular motion intact. Vision grossly normal with lenses Ears: External  ear exam reveals no significant lesions or deformities. Some wax on L. Hearing is grossly normal bilaterally. Nose: External nasal exam reveals no deformity or inflammation. Nasal mucosa are pink and moist. No lesions or exudates noted.   Mouth: Oral mucosa and oropharynx reveal no lesions or exudates. Teeth tobacco stained. Neck: No deformities, masses, or tenderness noted.  Thyroid normal. Lungs: Low-grade rhonchi and rales without increased work of breathing. Heart: Normal rate and rhythm. Normal S1 and S2. No gallop, click, or rub. No murmur. Abdomen: Bowel sounds normal; abdomen soft and nontender. No masses, organomegaly or hernias noted. Genitalia: declined                         Musculoskeletal/extremities: No clubbing, cyanosis, edema, or significant extremity  deformity noted. Range of motion normal .Tone & strength  Normal. Joints normal . Nail health good. Able to lie down & sit up w/o help. Negative SLR bilaterally Vascular: Carotid, radial artery, dorsalis pedis and  posterior tibial pulses are full and equal. No bruits present. Neurologic: Alert and oriented x3. Deep tendon reflexes symmetrical and normal.        Skin: Intact without suspicious lesions or rashes. Lymph: No cervical, axillary lymphadenopathy present. Psych: Mood and  affect are normal. Normally interactive                                                                                        Assessment & Plan:

## 2012-08-09 NOTE — Patient Instructions (Addendum)
Please perform isometric exercises before going to bed; Magcal or Calmag may help the muscle symptoms as we discussed.  If you activate the  My Chart system; lab & Xray results will be released directly  to you as soon as I review & address these through the computer. If you choose not to sign up for My Chart within 36 hours of labs being drawn; results will be reviewed & interpretation added before being copied & mailed, causing a delay in getting the results to you.If you do not receive that report within 7-10 days ,please call. Additionally you can use this system to gain direct  access to your records  if  out of town or @ an office of a  physician who is not in  the My Chart network.  This improves continuity of care & places you in control of your medical record.  Please  consider fasting Labs :hepatic panel, Lipids, BMET, CBC & dif, TSH.PLEASE BRING THESE INSTRUCTIONS TO FOLLOW UP  LAB APPOINTMENT.This will guarantee correct labs are drawn, eliminating need for repeat blood sampling ( needle sticks ! ). Diagnoses /Codes: PLEASE BRING THESE INSTRUCTIONS TO FOLLOW UP  LAB APPOINTMENT.This will guarantee correct labs are drawn, eliminating need for repeat blood sampling ( needle sticks ! ). Diagnoses /Codes: V70.0

## 2012-08-10 DIAGNOSIS — F17200 Nicotine dependence, unspecified, uncomplicated: Secondary | ICD-10-CM | POA: Insufficient documentation

## 2012-08-10 LAB — CK: Total CK: 121 U/L (ref 7–232)

## 2012-08-10 LAB — IBC PANEL
Iron: 72 ug/dL (ref 42–165)
Saturation Ratios: 18 % — ABNORMAL LOW (ref 20.0–50.0)
Transferrin: 285.3 mg/dL (ref 212.0–360.0)

## 2012-08-10 LAB — CALCIUM: Calcium: 9.5 mg/dL (ref 8.4–10.5)

## 2012-08-10 LAB — MAGNESIUM: Magnesium: 2.3 mg/dL (ref 1.5–2.5)

## 2012-08-10 NOTE — Assessment & Plan Note (Signed)
He has not given the Requip an adequate trial but expresses minimal interest in resuming this. Alprazolam is no longer effective; he requested an increased dose.  Alprazolam will be changed to clonazepam with titration of dose if needed. Alprazolam and clonazepam should not be taken together.  Also mentioned options of isometrics prior to going to bed and MagCal or CalMag supplementation at bedtime.  Iron studies, calcium, magnesium, potassium, and CK ordered.

## 2012-08-10 NOTE — Assessment & Plan Note (Signed)
Blood pressure control is good; no change indicated.

## 2012-08-10 NOTE — Assessment & Plan Note (Signed)
Reactive airways component to his COPD suggested clinically. Cardiovascular risk discussed. He expressed no interest in tobacco cessation program.

## 2012-08-27 ENCOUNTER — Emergency Department (HOSPITAL_BASED_OUTPATIENT_CLINIC_OR_DEPARTMENT_OTHER)
Admission: EM | Admit: 2012-08-27 | Discharge: 2012-08-27 | Disposition: A | Discharge status: Home / Self Care | Payer: Managed Care, Other (non HMO) | Attending: Emergency Medicine | Admitting: Emergency Medicine

## 2012-08-27 ENCOUNTER — Encounter (HOSPITAL_BASED_OUTPATIENT_CLINIC_OR_DEPARTMENT_OTHER): Payer: Self-pay | Admitting: Family Medicine

## 2012-08-27 ENCOUNTER — Emergency Department (HOSPITAL_BASED_OUTPATIENT_CLINIC_OR_DEPARTMENT_OTHER): Payer: Managed Care, Other (non HMO)

## 2012-08-27 DIAGNOSIS — W108XXA Fall (on) (from) other stairs and steps, initial encounter: Secondary | ICD-10-CM | POA: Insufficient documentation

## 2012-08-27 DIAGNOSIS — I10 Essential (primary) hypertension: Secondary | ICD-10-CM | POA: Insufficient documentation

## 2012-08-27 DIAGNOSIS — S5001XA Contusion of right elbow, initial encounter: Secondary | ICD-10-CM

## 2012-08-27 DIAGNOSIS — Z8639 Personal history of other endocrine, nutritional and metabolic disease: Secondary | ICD-10-CM | POA: Insufficient documentation

## 2012-08-27 DIAGNOSIS — Y9302 Activity, running: Secondary | ICD-10-CM | POA: Insufficient documentation

## 2012-08-27 DIAGNOSIS — Z9889 Other specified postprocedural states: Secondary | ICD-10-CM | POA: Insufficient documentation

## 2012-08-27 DIAGNOSIS — Z79899 Other long term (current) drug therapy: Secondary | ICD-10-CM | POA: Insufficient documentation

## 2012-08-27 DIAGNOSIS — J449 Chronic obstructive pulmonary disease, unspecified: Secondary | ICD-10-CM | POA: Insufficient documentation

## 2012-08-27 DIAGNOSIS — Z860100 Personal history of colon polyps, unspecified: Secondary | ICD-10-CM | POA: Insufficient documentation

## 2012-08-27 DIAGNOSIS — Z8679 Personal history of other diseases of the circulatory system: Secondary | ICD-10-CM | POA: Insufficient documentation

## 2012-08-27 DIAGNOSIS — F411 Generalized anxiety disorder: Secondary | ICD-10-CM | POA: Insufficient documentation

## 2012-08-27 DIAGNOSIS — J4489 Other specified chronic obstructive pulmonary disease: Secondary | ICD-10-CM | POA: Insufficient documentation

## 2012-08-27 DIAGNOSIS — S6980XA Other specified injuries of unspecified wrist, hand and finger(s), initial encounter: Secondary | ICD-10-CM | POA: Insufficient documentation

## 2012-08-27 DIAGNOSIS — Z8719 Personal history of other diseases of the digestive system: Secondary | ICD-10-CM | POA: Insufficient documentation

## 2012-08-27 DIAGNOSIS — Z862 Personal history of diseases of the blood and blood-forming organs and certain disorders involving the immune mechanism: Secondary | ICD-10-CM | POA: Insufficient documentation

## 2012-08-27 DIAGNOSIS — S5000XA Contusion of unspecified elbow, initial encounter: Secondary | ICD-10-CM | POA: Insufficient documentation

## 2012-08-27 DIAGNOSIS — Y929 Unspecified place or not applicable: Secondary | ICD-10-CM | POA: Insufficient documentation

## 2012-08-27 DIAGNOSIS — Z96619 Presence of unspecified artificial shoulder joint: Secondary | ICD-10-CM | POA: Insufficient documentation

## 2012-08-27 DIAGNOSIS — S6990XA Unspecified injury of unspecified wrist, hand and finger(s), initial encounter: Secondary | ICD-10-CM | POA: Insufficient documentation

## 2012-08-27 DIAGNOSIS — S46909A Unspecified injury of unspecified muscle, fascia and tendon at shoulder and upper arm level, unspecified arm, initial encounter: Secondary | ICD-10-CM | POA: Insufficient documentation

## 2012-08-27 DIAGNOSIS — M1990 Unspecified osteoarthritis, unspecified site: Secondary | ICD-10-CM | POA: Insufficient documentation

## 2012-08-27 DIAGNOSIS — G2581 Restless legs syndrome: Secondary | ICD-10-CM | POA: Insufficient documentation

## 2012-08-27 DIAGNOSIS — Z8709 Personal history of other diseases of the respiratory system: Secondary | ICD-10-CM | POA: Insufficient documentation

## 2012-08-27 DIAGNOSIS — Z872 Personal history of diseases of the skin and subcutaneous tissue: Secondary | ICD-10-CM | POA: Insufficient documentation

## 2012-08-27 DIAGNOSIS — Z8669 Personal history of other diseases of the nervous system and sense organs: Secondary | ICD-10-CM | POA: Insufficient documentation

## 2012-08-27 DIAGNOSIS — F17200 Nicotine dependence, unspecified, uncomplicated: Secondary | ICD-10-CM | POA: Insufficient documentation

## 2012-08-27 DIAGNOSIS — G479 Sleep disorder, unspecified: Secondary | ICD-10-CM | POA: Insufficient documentation

## 2012-08-27 DIAGNOSIS — S4980XA Other specified injuries of shoulder and upper arm, unspecified arm, initial encounter: Secondary | ICD-10-CM | POA: Insufficient documentation

## 2012-08-27 MED ORDER — OXYCODONE-ACETAMINOPHEN 10-325 MG PO TABS: 1.0000 | ORAL_TABLET | ORAL | Status: DC | PRN

## 2012-08-27 NOTE — ED Notes (Signed)
Pt sts he slipped and fell yesterday landing on right arm. Pt c/o pain to right elbow, right pinky finger and hand. Denies loc, did not hit head

## 2012-08-27 NOTE — ED Provider Notes (Signed)
[facility] called to advise of patient receiving 90 oxycodone on 5/7 14 from Dr. Ethelene Hal   Note reviewed without identifying that patient was taking narcotics.  Pharmacy advised not to dispense.   Hilario Quarry, MD 08/27/12 [phone]
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
## 2012-08-27 NOTE — ED Notes (Signed)
[employer] [employer] called, spoke with Rayfield Citizen.  Informed that the pt received 90 tablets of percocet 10mg  on 08/19/12, which should have been a 30 day supply.  States pt presented to their pharmacy today with a prescription for Percocet 10mg  /325mg  tylenol 20 tablets from our facility today.  Wanted to know if they should fill the prescription or not.  Chart reviewed with Dr. Rosalia Hammers.  Orders received to disregard the prescription.

## 2012-09-01 ENCOUNTER — Encounter: Payer: Self-pay | Admitting: Internal Medicine

## 2012-10-28 ENCOUNTER — Other Ambulatory Visit: Payer: Self-pay | Admitting: Internal Medicine

## 2012-11-22 ENCOUNTER — Other Ambulatory Visit: Payer: Self-pay | Admitting: Pulmonary Disease

## 2012-12-10 ENCOUNTER — Other Ambulatory Visit: Payer: Self-pay | Admitting: Pulmonary Disease

## 2013-02-09 ENCOUNTER — Encounter (HOSPITAL_BASED_OUTPATIENT_CLINIC_OR_DEPARTMENT_OTHER): Payer: Self-pay | Admitting: Emergency Medicine

## 2013-02-09 ENCOUNTER — Emergency Department (HOSPITAL_BASED_OUTPATIENT_CLINIC_OR_DEPARTMENT_OTHER): Payer: Managed Care, Other (non HMO)

## 2013-02-09 ENCOUNTER — Emergency Department (HOSPITAL_BASED_OUTPATIENT_CLINIC_OR_DEPARTMENT_OTHER)
Admission: EM | Admit: 2013-02-09 | Discharge: 2013-02-09 | Disposition: A | Discharge status: Home / Self Care | Payer: Managed Care, Other (non HMO) | Attending: Emergency Medicine | Admitting: Emergency Medicine

## 2013-02-09 DIAGNOSIS — Z862 Personal history of diseases of the blood and blood-forming organs and certain disorders involving the immune mechanism: Secondary | ICD-10-CM | POA: Insufficient documentation

## 2013-02-09 DIAGNOSIS — Z8719 Personal history of other diseases of the digestive system: Secondary | ICD-10-CM | POA: Insufficient documentation

## 2013-02-09 DIAGNOSIS — H05019 Cellulitis of unspecified orbit: Secondary | ICD-10-CM | POA: Insufficient documentation

## 2013-02-09 DIAGNOSIS — Z860100 Personal history of colon polyps, unspecified: Secondary | ICD-10-CM | POA: Insufficient documentation

## 2013-02-09 DIAGNOSIS — M129 Arthropathy, unspecified: Secondary | ICD-10-CM | POA: Insufficient documentation

## 2013-02-09 DIAGNOSIS — J449 Chronic obstructive pulmonary disease, unspecified: Secondary | ICD-10-CM | POA: Insufficient documentation

## 2013-02-09 DIAGNOSIS — Z8639 Personal history of other endocrine, nutritional and metabolic disease: Secondary | ICD-10-CM | POA: Insufficient documentation

## 2013-02-09 DIAGNOSIS — F411 Generalized anxiety disorder: Secondary | ICD-10-CM | POA: Insufficient documentation

## 2013-02-09 DIAGNOSIS — J320 Chronic maxillary sinusitis: Secondary | ICD-10-CM

## 2013-02-09 DIAGNOSIS — Z79899 Other long term (current) drug therapy: Secondary | ICD-10-CM | POA: Insufficient documentation

## 2013-02-09 DIAGNOSIS — J4489 Other specified chronic obstructive pulmonary disease: Secondary | ICD-10-CM | POA: Insufficient documentation

## 2013-02-09 DIAGNOSIS — F17200 Nicotine dependence, unspecified, uncomplicated: Secondary | ICD-10-CM | POA: Insufficient documentation

## 2013-02-09 DIAGNOSIS — I10 Essential (primary) hypertension: Secondary | ICD-10-CM | POA: Insufficient documentation

## 2013-02-09 DIAGNOSIS — J0100 Acute maxillary sinusitis, unspecified: Secondary | ICD-10-CM | POA: Insufficient documentation

## 2013-02-09 LAB — CBC
MCH: 32 pg (ref 26.0–34.0)
MCV: 94.3 fL (ref 78.0–100.0)
Platelets: 341 10*3/uL (ref 150–400)
RBC: 4.4 MIL/uL (ref 4.22–5.81)
RDW: 12.1 % (ref 11.5–15.5)

## 2013-02-09 LAB — BASIC METABOLIC PANEL
CO2: 30 mEq/L (ref 19–32)
Calcium: 9.8 mg/dL (ref 8.4–10.5)
Creatinine, Ser: 1.3 mg/dL (ref 0.50–1.35)
Glucose, Bld: 98 mg/dL (ref 70–99)

## 2013-02-09 MED ORDER — IOHEXOL 300 MG/ML  SOLN
75.0000 mL | Freq: Once | INTRAMUSCULAR | Status: AC | PRN
  Administered 2013-02-09: 75 mL via INTRAVENOUS

## 2013-02-09 MED ORDER — SODIUM CHLORIDE 0.9 % IV SOLN
INTRAVENOUS | Status: DC
  Administered 2013-02-09: 125 mL/h via INTRAVENOUS

## 2013-02-09 MED ORDER — AMOXICILLIN-POT CLAVULANATE 875-125 MG PO TABS: 1.0000 | ORAL_TABLET | Freq: Two times a day (BID) | ORAL | Status: DC

## 2013-02-09 MED ORDER — HYDROMORPHONE HCL PF 1 MG/ML IJ SOLN
1.0000 mg | Freq: Once | INTRAMUSCULAR | Status: AC
  Administered 2013-02-09: 1 mg via INTRAVENOUS
  Filled 2013-02-09: qty 1

## 2013-02-09 MED ORDER — METOCLOPRAMIDE HCL 5 MG/ML IJ SOLN
5.0000 mg | Freq: Once | INTRAMUSCULAR | Status: AC
  Administered 2013-02-09: 5 mg via INTRAVENOUS
  Filled 2013-02-09: qty 2

## 2013-02-09 MED ORDER — DIPHENHYDRAMINE HCL 50 MG/ML IJ SOLN
25.0000 mg | Freq: Once | INTRAMUSCULAR | Status: AC
  Administered 2013-02-09: 25 mg via INTRAVENOUS
  Filled 2013-02-09: qty 1

## 2013-02-09 MED ORDER — HYDROMORPHONE HCL 2 MG PO TABS: 2.0000 mg | ORAL_TABLET | ORAL | Status: DC | PRN
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
## 2013-02-09 NOTE — ED Notes (Signed)
Patient transported to CT 

## 2013-02-09 NOTE — ED Notes (Signed)
Pt c/o left eye pain onset 1 week ago,  Denies ing, slight swelling,  Also c/o left knee pain for a long time  Pt a&o ambulatory

## 2013-03-02 ENCOUNTER — Other Ambulatory Visit: Payer: Self-pay | Admitting: Internal Medicine

## 2013-03-04 ENCOUNTER — Other Ambulatory Visit: Payer: Self-pay | Admitting: Internal Medicine

## 2013-03-04 NOTE — Telephone Encounter (Signed)
OK X1 

## 2013-03-04 NOTE — Telephone Encounter (Signed)
Patient is requesting refill for Klonopin.   Last visit-08/09/2012  Last filled-10/28/2012  UDS-08/09/2012 low risk, contract signed  Please advise. SW

## 2013-03-04 NOTE — Telephone Encounter (Signed)
Amlodipine refilled per protocol 

## 2013-03-30 ENCOUNTER — Ambulatory Visit (INDEPENDENT_AMBULATORY_CARE_PROVIDER_SITE_OTHER): Payer: Managed Care, Other (non HMO) | Admitting: Internal Medicine

## 2013-03-30 ENCOUNTER — Encounter: Payer: Self-pay | Admitting: Internal Medicine

## 2013-03-30 VITALS — BP 148/79 | HR 80 | Temp 98.4°F | Resp 15 | Wt 185.6 lb

## 2013-03-30 DIAGNOSIS — I10 Essential (primary) hypertension: Secondary | ICD-10-CM

## 2013-03-30 DIAGNOSIS — I7300 Raynaud's syndrome without gangrene: Secondary | ICD-10-CM

## 2013-03-30 DIAGNOSIS — R0789 Other chest pain: Secondary | ICD-10-CM

## 2013-03-30 DIAGNOSIS — F17200 Nicotine dependence, unspecified, uncomplicated: Secondary | ICD-10-CM

## 2013-03-30 DIAGNOSIS — J449 Chronic obstructive pulmonary disease, unspecified: Secondary | ICD-10-CM

## 2013-03-30 MED ORDER — LOSARTAN POTASSIUM 50 MG PO TABS: 50.0000 mg | ORAL_TABLET | Freq: Every day | ORAL | Status: DC

## 2013-03-30 NOTE — Patient Instructions (Addendum)
Protect  hands, feet, and ears from cold exposure including ice chests. Use silk sock & mitten liners; this can be purchased at outdoor supply stores .Minimal Blood Pressure Goal= AVERAGE < 140/90;  Ideal is an AVERAGE < 135/85. This AVERAGE should be calculated from @ least 5-7 BP readings taken @ different times of day on different days of week. You should not respond to isolated BP readings , but rather the AVERAGE for that week .Please bring your  blood pressure cuff to office visits to verify that it is reliable.It  can also be checked against the blood pressure device at the pharmacy. Finger or wrist cuffs are not dependable; an arm cuff is. Please fill losartan prescription if your blood pressure average is over 140/90 on monitor.  Advair (sample given) one  inhalation every 12 hours; gargle and spit after use

## 2013-03-30 NOTE — Progress Notes (Signed)
Pre visit review using our clinic review tool, if applicable. No additional management support is needed unless otherwise documented below in the visit note. 

## 2013-03-30 NOTE — Progress Notes (Signed)
   Subjective:    Patient ID: Peter Vang, male    DOB: 07-07-1965, 47 y.o.   MRN: [id number]  HPI   For the last month he's noted swelling of his hands. 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  He's also noted that with cold exposure the third and fourth fingers of his right hand turned white and actually hurt. As the hand warms up he notices the color return.  He smokes one pack per day.    Review of Systems He has intermittent substernal "gas discomfort". This has occurred with exertion.  He also has numbness in the right posterior thigh in the context of having had a partially ruptured disc with additional disc disease. He sees Dr. Ethelene Hal for this; he is on narcotic pain medicine from that physician.  He is anxious to have his help as well refilled as he uses for sleep     Objective:   Physical Exam  Appears adequately nourished & in no acute distress.Strong tobacco ordor  No carotid bruits are present.No neck pain distention present at 10 - 15 degrees. Thyroid normal to palpation  Heart rhythm and rate are normal with no significant murmurs or gallops.  Chest reveals coarse rhonchi and wheezing, particularly at the bases. No  increased work of breathing  There is no evidence of aortic aneurysm or renal artery bruits  Abdomen soft with no organomegaly or masses. No HJR  No clubbing, cyanosis or edema present.  Pedal pulses are intact   No ischemic skin changes are present , but complexion dusky.  Alert and oriented. Strength, tone, DTRs reflexes normal  Negative SLR          Assessment & Plan:  #1 edema of the hands; this would not be related to the amlodipine he is taking. It may be due to repetitive trauma ; less likely is DJD  arthralgia variant.  #2 dramatic Raynaud's phenomena, rule out Raynaud's disease  #3 COPD with asthmatic bronchitic component  #4 atypical chest pain. EKG does not reveal ischemia. It did reveal  peaked anterior T waves. Hyperkalemia was questioned. His most recent lab studies revealed a potassium of 4.2.  Plan: The pathophysiology of Raynaud's and reactive airways disease was discussed.Smoking & other cardiovascular risk were discussed. Fasting lipids and A1c were recommended to assess long term risk.  See recommendations in AVS.

## 2013-03-31 ENCOUNTER — Other Ambulatory Visit: Payer: Self-pay | Admitting: *Deleted

## 2013-03-31 ENCOUNTER — Telehealth: Payer: Self-pay | Admitting: Internal Medicine

## 2013-03-31 MED ORDER — ALPRAZOLAM 1 MG PO TABS: ORAL_TABLET | ORAL | Status: DC

## 2013-03-31 NOTE — Telephone Encounter (Signed)
Please advise. I do not see in his chart where this was written yesterday.

## 2013-03-31 NOTE — Telephone Encounter (Signed)
Patient came in for an appointment yesterday and left with a Xanax rx that was not signed by Dr. Alwyn Ren. He ask that we fax his prescription to [facility] on [address]. Please advise.

## 2013-03-31 NOTE — Telephone Encounter (Signed)
   I am very sorry; please return the prescription for me to sign

## 2013-03-31 NOTE — Telephone Encounter (Signed)
A prescription for Xanax 1 mg one half to one at bedtime as needed was faxed to his pharmacy. He is to bring in the unsigned Xanax prescription before any additional refills will be issued.

## 2013-04-26 ENCOUNTER — Telehealth: Payer: Self-pay | Admitting: *Deleted

## 2013-04-26 MED ORDER — ALPRAZOLAM 1 MG PO TABS: ORAL_TABLET | ORAL | Status: DC

## 2013-04-26 NOTE — Telephone Encounter (Signed)
Patient's wife is calling for refill on Xanax. Wife states that she does have the old prescription to bring in.  Last seen-03/30/2013  Last filled-03/31/2013  UDS-08/09/2012 low risk, contract signed   Please advise SW

## 2013-04-26 NOTE — Telephone Encounter (Signed)
OK X1 

## 2013-04-26 NOTE — Telephone Encounter (Signed)
Med filled and wife notified to make sure she brings in old script

## 2013-04-26 NOTE — Addendum Note (Signed)
Addended by: Beckie Busing on: 04/26/2013 04:55 PM   Modules accepted: Orders

## 2013-06-03 ENCOUNTER — Telehealth: Payer: Self-pay | Admitting: *Deleted

## 2013-06-03 MED ORDER — ALPRAZOLAM 1 MG PO TABS: ORAL_TABLET | ORAL | Status: DC

## 2013-06-03 NOTE — Addendum Note (Signed)
Addended by: Harl Bowie on: 06/03/2013 05:43 PM   Modules accepted: Orders

## 2013-06-03 NOTE — Telephone Encounter (Signed)
Requesting Alprazolam 1mg  Take 1 tablet by mouth at bedtime as needed. Last refill:04-26-13:#30,0 Last OV:03-30-13 UDS:08-09-12:Low risk Please advise.//AB/CMA

## 2013-06-03 NOTE — Telephone Encounter (Signed)
OK X1 

## 2013-06-03 NOTE — Telephone Encounter (Signed)
Rx printed and faxed to the pharmacy.//AB/CMA 

## 2013-06-25 ENCOUNTER — Encounter: Payer: Self-pay | Admitting: Internal Medicine

## 2013-06-25 DIAGNOSIS — G894 Chronic pain syndrome: Secondary | ICD-10-CM | POA: Insufficient documentation

## 2013-07-01 ENCOUNTER — Telehealth: Payer: Self-pay | Admitting: *Deleted

## 2013-07-01 ENCOUNTER — Other Ambulatory Visit: Payer: Self-pay | Admitting: Internal Medicine

## 2013-07-01 MED ORDER — ALPRAZOLAM 1 MG PO TABS: ORAL_TABLET | ORAL | Status: DC

## 2013-07-01 NOTE — Telephone Encounter (Signed)
Pts wife called requesting refill alprazolam 1mg  for husband.  Last OV - 03/30/13 Last refilled- 06/03/13 #30 / 0 rf UDS- 08/09/12 LOW risk

## 2013-07-01 NOTE — Telephone Encounter (Signed)
Rx printed and faxed to the pharmacy.  Pt aware.//AB/CMA

## 2013-07-28 ENCOUNTER — Other Ambulatory Visit: Payer: Self-pay

## 2013-07-28 MED ORDER — ALPRAZOLAM 1 MG PO TABS: ORAL_TABLET | ORAL | Status: DC

## 2013-07-28 NOTE — Telephone Encounter (Signed)
Alprazolam has been called to pharmacy  

## 2013-07-28 NOTE — Telephone Encounter (Signed)
Last OV 03/30/13 Med last filled 07/01/13 #30

## 2013-08-26 ENCOUNTER — Other Ambulatory Visit: Payer: Self-pay | Admitting: Internal Medicine

## 2013-08-28 NOTE — Telephone Encounter (Signed)
OK X1 

## 2013-08-29 ENCOUNTER — Other Ambulatory Visit: Payer: Self-pay | Admitting: Pulmonary Disease

## 2013-08-29 NOTE — Telephone Encounter (Signed)
Faxed script back to [facility].../lmb 

## 2013-08-30 ENCOUNTER — Other Ambulatory Visit: Payer: Self-pay | Admitting: Pulmonary Disease

## 2013-09-28 ENCOUNTER — Other Ambulatory Visit: Payer: Self-pay | Admitting: Internal Medicine

## 2013-09-28 NOTE — Telephone Encounter (Signed)
Last office visit 03/30/13

## 2013-09-28 NOTE — Telephone Encounter (Signed)
Last office visit on 03/30/2013

## 2013-09-28 NOTE — Telephone Encounter (Signed)
done

## 2013-09-28 NOTE — Telephone Encounter (Signed)
OK X1 

## 2013-10-17 ENCOUNTER — Other Ambulatory Visit: Payer: Self-pay

## 2013-10-17 MED ORDER — AMLODIPINE BESYLATE 5 MG PO TABS: 5.0000 mg | ORAL_TABLET | Freq: Every day | ORAL | Status: DC

## 2013-10-26 ENCOUNTER — Other Ambulatory Visit: Payer: Self-pay | Admitting: Internal Medicine

## 2013-10-26 NOTE — Telephone Encounter (Signed)
OK X1  Last seen 12/14 This is high risk , controlled substance as per Dr Sheria Lang' s list.OV before next refill as per [facility] Medical Board

## 2013-11-08 ENCOUNTER — Other Ambulatory Visit: Payer: Self-pay

## 2013-11-08 DIAGNOSIS — I10 Essential (primary) hypertension: Secondary | ICD-10-CM

## 2013-11-08 MED ORDER — LOSARTAN POTASSIUM 50 MG PO TABS
50.0000 mg | ORAL_TABLET | Freq: Every day | ORAL | Status: DC
Start: 2013-11-08 — End: 2013-11-30

## 2013-11-28 ENCOUNTER — Other Ambulatory Visit: Payer: Self-pay | Admitting: Internal Medicine

## 2013-11-30 ENCOUNTER — Ambulatory Visit (INDEPENDENT_AMBULATORY_CARE_PROVIDER_SITE_OTHER): Payer: Managed Care, Other (non HMO) | Admitting: Internal Medicine

## 2013-11-30 ENCOUNTER — Other Ambulatory Visit (INDEPENDENT_AMBULATORY_CARE_PROVIDER_SITE_OTHER): Payer: Managed Care, Other (non HMO)

## 2013-11-30 ENCOUNTER — Encounter: Payer: Self-pay | Admitting: Internal Medicine

## 2013-11-30 VITALS — BP 150/100 | HR 90 | Temp 97.9°F | Wt 195.0 lb

## 2013-11-30 DIAGNOSIS — K2100 Gastro-esophageal reflux disease with esophagitis, without bleeding: Secondary | ICD-10-CM

## 2013-11-30 DIAGNOSIS — F4322 Adjustment disorder with anxiety: Secondary | ICD-10-CM

## 2013-11-30 DIAGNOSIS — I10 Essential (primary) hypertension: Secondary | ICD-10-CM

## 2013-11-30 DIAGNOSIS — G479 Sleep disorder, unspecified: Secondary | ICD-10-CM

## 2013-11-30 DIAGNOSIS — I7300 Raynaud's syndrome without gangrene: Secondary | ICD-10-CM

## 2013-11-30 DIAGNOSIS — J449 Chronic obstructive pulmonary disease, unspecified: Secondary | ICD-10-CM

## 2013-11-30 DIAGNOSIS — G4700 Insomnia, unspecified: Secondary | ICD-10-CM

## 2013-11-30 DIAGNOSIS — R7989 Other specified abnormal findings of blood chemistry: Secondary | ICD-10-CM

## 2013-11-30 DIAGNOSIS — F411 Generalized anxiety disorder: Secondary | ICD-10-CM

## 2013-11-30 DIAGNOSIS — F17200 Nicotine dependence, unspecified, uncomplicated: Secondary | ICD-10-CM

## 2013-11-30 LAB — TSH: TSH: 1.24 u[IU]/mL (ref 0.35–4.50)

## 2013-11-30 LAB — LIPID PANEL
CHOLESTEROL: 222 mg/dL — AB (ref 0–200)
HDL: 38.8 mg/dL — ABNORMAL LOW (ref >39.00)
NonHDL: 183.2
Total CHOL/HDL Ratio: 6
Triglycerides: 254 mg/dL — ABNORMAL HIGH (ref 0.0–149.0)
VLDL: 50.8 mg/dL — ABNORMAL HIGH (ref 0.0–40.0)

## 2013-11-30 LAB — BASIC METABOLIC PANEL
BUN: 16 mg/dL (ref 6–23)
CHLORIDE: 99 meq/L (ref 96–112)
CO2: 31 meq/L (ref 19–32)
CREATININE: 1 mg/dL (ref 0.4–1.5)
Calcium: 9.6 mg/dL (ref 8.4–10.5)
GFR: 80.42 mL/min (ref >60.00)
GLUCOSE: 98 mg/dL (ref 70–99)
Potassium: 5 mEq/L (ref 3.5–5.1)
Sodium: 136 mEq/L (ref 135–145)

## 2013-11-30 LAB — LDL CHOLESTEROL, DIRECT: Direct LDL: 157.4 mg/dL

## 2013-11-30 LAB — HEMOGLOBIN A1C: HEMOGLOBIN A1C: 5.8 % (ref 4.6–6.5)

## 2013-11-30 MED ORDER — LOSARTAN POTASSIUM 100 MG PO TABS: 100.0000 mg | ORAL_TABLET | Freq: Every day | ORAL | Status: DC

## 2013-11-30 MED ORDER — BUPROPION HCL 75 MG PO TABS: 75.0000 mg | ORAL_TABLET | Freq: Two times a day (BID) | ORAL | Status: DC

## 2013-11-30 MED ORDER — ALPRAZOLAM 1 MG PO TABS: 1.0000 mg | ORAL_TABLET | Freq: Every evening | ORAL | Status: DC | PRN

## 2013-11-30 NOTE — Progress Notes (Signed)
   Subjective:    Patient ID: Peter Vang, male    DOB: [birth year], 48 y.o.   MRN: [id number]  HPI He has been using the Xanax only once a day at bedtime for sleep. He states that he cannot "shut (his) mind down" races" .He has been evaluated by a sleep specialist, but no sleep study done. He does describe some daytime somnolence. He does not feel rested upon awakening. He describes increased life stressors. He continues to smoke a pack a day but has been trying the e-cigarette. He continues to have intermittent cough and shortness of breath. Blood pressure is not monitored home on routine basis; but he feels it averages 120/80 based on several readings. He describes himself as a "salt lover".      Review of Systems  Chest pain, palpitations, tachycardia,  paroxysmal nocturnal dyspnea, claudication or edema are absent.  Intermittent dysphagia described.PMH of esophageal dilation.  Narcotic Rxed by Orthopedics; tentatively scheduled for "meniscus replacement".       Objective:   Physical Exam Pertinent positive findings include: Slightly dusky,weathered facies Voice is slightly hoarse. Repeat BP 142/92. Chest is surprisingly clear without abnormal breath sounds or increased work of breathing.  Appears adequately nourished & in no acute distress No carotid bruits are present.No neck pain distention present at 10 - 15 degrees. Thyroid normal to palpation Heart rhythm and rate are normal with no gallop or murmur There is no evidence of aortic aneurysm or renal artery bruits Abdomen soft with no organomegaly or masses. No HJR No clubbing, cyanosis or edema present. Pedal pulses are intact  No ischemic skin changes are present . Fingernails healthy  Alert and oriented; affect slightly tense. Strength, tone, DTRs reflexes normal        Assessment & Plan:  See Current Assessment & Plan in Problem List under specific Diagnosis

## 2013-11-30 NOTE — Patient Instructions (Signed)
Minimal Blood Pressure Goal= AVERAGE < 140/90;  Ideal is an AVERAGE < 135/85. This AVERAGE should be calculated from @ least 5-7 BP readings taken @ different times of day on different days of week. You should not respond to isolated BP readings , but rather the AVERAGE for that week .Please bring your  blood pressure cuff to office visits to verify that it is reliable.It  can also be checked against the blood pressure device at the pharmacy. Finger or wrist cuffs are not dependable; an arm cuff is.  To prevent sleep dysfunction follow these instructions for sleep hygiene. Do not read, watch TV, or eat in bed. Do not get into bed until you are ready to turn off the light &  to go to sleep. Do not ingest stimulants ( decongestants, diet pills, nicotine, caffeine) after the evening meal.Do not take daytime naps.Cardiovascular exercise, this can be as simple a program as walking, is recommended 30-45 minutes 3-4 times per week. If you're not exercising you should take 6-8 weeks to build up to this level.  As we discussed the neurotransmitters are essential for good brain function, both intellectually & emotionally. The agents to increase the neurotransmitter levels are not addictive and simply keep this essential neurotransmitter at therapeutic levels. If these levels become severely depleted; depression or panic attacks can occur.

## 2013-11-30 NOTE — Progress Notes (Signed)
Pre visit review using our clinic review tool, if applicable. No additional management support is needed unless otherwise documented below in the visit note. 

## 2013-12-02 DIAGNOSIS — F411 Generalized anxiety disorder: Secondary | ICD-10-CM | POA: Insufficient documentation

## 2013-12-02 NOTE — Assessment & Plan Note (Signed)
As per Dr Carlean Purl

## 2013-12-02 NOTE — Assessment & Plan Note (Signed)
Rx for Wellbutrin as trial E cig data reviewed

## 2013-12-02 NOTE — Assessment & Plan Note (Signed)
The pathophysiology of neurotransmitter deficiency was discussed along with the benefits and potential adverse effects of trial of Wellbutrin

## 2013-12-02 NOTE — Assessment & Plan Note (Signed)
Blood pressure goals reviewed. See AVS

## 2013-12-02 NOTE — Assessment & Plan Note (Signed)
Sleep hygiene discussed 

## 2013-12-02 NOTE — Assessment & Plan Note (Signed)
Smoking risks & E cigarette data discussed

## 2013-12-04 ENCOUNTER — Encounter: Payer: Self-pay | Admitting: Internal Medicine

## 2013-12-04 DIAGNOSIS — E782 Mixed hyperlipidemia: Secondary | ICD-10-CM | POA: Insufficient documentation

## 2013-12-18 ENCOUNTER — Emergency Department (HOSPITAL_COMMUNITY)
Admission: EM | Admit: 2013-12-18 | Discharge: 2013-12-18 | Disposition: A | Discharge status: Home / Self Care | Payer: Managed Care, Other (non HMO) | Attending: Emergency Medicine | Admitting: Emergency Medicine

## 2013-12-18 ENCOUNTER — Encounter (HOSPITAL_COMMUNITY): Payer: Self-pay | Admitting: Emergency Medicine

## 2013-12-18 ENCOUNTER — Emergency Department (HOSPITAL_COMMUNITY): Payer: Managed Care, Other (non HMO)

## 2013-12-18 DIAGNOSIS — I10 Essential (primary) hypertension: Secondary | ICD-10-CM | POA: Diagnosis not present

## 2013-12-18 DIAGNOSIS — M25569 Pain in unspecified knee: Secondary | ICD-10-CM | POA: Insufficient documentation

## 2013-12-18 DIAGNOSIS — G8918 Other acute postprocedural pain: Secondary | ICD-10-CM | POA: Diagnosis not present

## 2013-12-18 DIAGNOSIS — F17200 Nicotine dependence, unspecified, uncomplicated: Secondary | ICD-10-CM | POA: Insufficient documentation

## 2013-12-18 DIAGNOSIS — Z79899 Other long term (current) drug therapy: Secondary | ICD-10-CM | POA: Diagnosis not present

## 2013-12-18 DIAGNOSIS — Z862 Personal history of diseases of the blood and blood-forming organs and certain disorders involving the immune mechanism: Secondary | ICD-10-CM | POA: Diagnosis not present

## 2013-12-18 DIAGNOSIS — F411 Generalized anxiety disorder: Secondary | ICD-10-CM | POA: Insufficient documentation

## 2013-12-18 DIAGNOSIS — Z8639 Personal history of other endocrine, nutritional and metabolic disease: Secondary | ICD-10-CM | POA: Insufficient documentation

## 2013-12-18 DIAGNOSIS — M129 Arthropathy, unspecified: Secondary | ICD-10-CM | POA: Diagnosis not present

## 2013-12-18 DIAGNOSIS — Z7982 Long term (current) use of aspirin: Secondary | ICD-10-CM | POA: Diagnosis not present

## 2013-12-18 DIAGNOSIS — M25562 Pain in left knee: Secondary | ICD-10-CM

## 2013-12-18 DIAGNOSIS — J449 Chronic obstructive pulmonary disease, unspecified: Secondary | ICD-10-CM | POA: Insufficient documentation

## 2013-12-18 DIAGNOSIS — Z860100 Personal history of colon polyps, unspecified: Secondary | ICD-10-CM | POA: Insufficient documentation

## 2013-12-18 DIAGNOSIS — J4489 Other specified chronic obstructive pulmonary disease: Secondary | ICD-10-CM | POA: Insufficient documentation

## 2013-12-18 LAB — BASIC METABOLIC PANEL
Anion gap: 12 (ref 5–15)
BUN: 22 mg/dL (ref 6–23)
CO2: 29 mEq/L (ref 19–32)
Calcium: 9.5 mg/dL (ref 8.4–10.5)
Chloride: 99 mEq/L (ref 96–112)
Creatinine, Ser: 1.07 mg/dL (ref 0.50–1.35)
GFR calc Af Amer: >90 mL/min (ref >90)
GFR calc non Af Amer: 80 mL/min — ABNORMAL LOW (ref >90)
GLUCOSE: 87 mg/dL (ref 70–99)
POTASSIUM: 4.4 meq/L (ref 3.7–5.3)
Sodium: 140 mEq/L (ref 137–147)

## 2013-12-18 LAB — CBC WITH DIFFERENTIAL/PLATELET
BASOS PCT: 1 % (ref 0–1)
Basophils Absolute: 0.1 10*3/uL (ref 0.0–0.1)
Eosinophils Absolute: 0.2 10*3/uL (ref 0.0–0.7)
Eosinophils Relative: 2 % (ref 0–5)
HCT: 41.1 % (ref 39.0–52.0)
HEMOGLOBIN: 14.2 g/dL (ref 13.0–17.0)
LYMPHS ABS: 1.9 10*3/uL (ref 0.7–4.0)
Lymphocytes Relative: 19 % (ref 12–46)
MCH: 32.8 pg (ref 26.0–34.0)
MCHC: 34.5 g/dL (ref 30.0–36.0)
MCV: 94.9 fL (ref 78.0–100.0)
MONOS PCT: 8 % (ref 3–12)
Monocytes Absolute: 0.8 10*3/uL (ref 0.1–1.0)
NEUTROS ABS: 7.5 10*3/uL (ref 1.7–7.7)
NEUTROS PCT: 72 % (ref 43–77)
Platelets: 272 10*3/uL (ref 150–400)
RBC: 4.33 MIL/uL (ref 4.22–5.81)
RDW: 12.9 % (ref 11.5–15.5)
WBC: 10.5 10*3/uL (ref 4.0–10.5)

## 2013-12-18 MED ORDER — HYDROMORPHONE HCL PF 1 MG/ML IJ SOLN
1.0000 mg | Freq: Once | INTRAMUSCULAR | Status: AC
  Administered 2013-12-18: 1 mg via INTRAVENOUS
  Filled 2013-12-18: qty 1

## 2013-12-18 MED ORDER — OXYCODONE HCL 5 MG PO TABS: 5.0000 mg | ORAL_TABLET | Freq: Four times a day (QID) | ORAL | Status: DC | PRN

## 2013-12-18 MED ORDER — OXYCODONE-ACETAMINOPHEN 5-325 MG PO TABS
1.0000 | ORAL_TABLET | Freq: Once | ORAL | Status: AC
  Administered 2013-12-18: 1 via ORAL
  Filled 2013-12-18: qty 1

## 2013-12-18 MED ORDER — HYDROMORPHONE HCL PF 1 MG/ML IJ SOLN: 1.0000 mg | Freq: Once | INTRAMUSCULAR | Status: DC

## 2013-12-18 MED ORDER — KETOROLAC TROMETHAMINE 30 MG/ML IJ SOLN
30.0000 mg | Freq: Once | INTRAMUSCULAR | Status: AC
  Administered 2013-12-18: 30 mg via INTRAVENOUS
  Filled 2013-12-18: qty 1

## 2013-12-18 NOTE — Discharge Instructions (Signed)
Wear knee splint as directed by your surgeon. Use crutches as directed by the surgeon. Ice and elevate knee throughout the day. Alternate between ibuprofen and your home percocet for pain, and use roxicodone for breakthrough pain relief. Do not drive or operate machinery with pain medication use. Call orthopedic follow up for an appointment on Wednesday. Return to the ER for changes or worsening symptoms.   Knee Pain Knee pain can be a result of an injury or other medical conditions. Treatment will depend on the cause of your pain. HOME CARE  Only take medicine as told by your doctor.  Keep a healthy weight. Being overweight can make the knee hurt more.  Stretch before exercising or playing sports.  If there is constant knee pain, change the way you exercise. Ask your doctor for advice.  Make sure shoes fit well. Choose the right shoe for the sport or activity.  Protect your knees. Wear kneepads if needed.  Rest when you are tired. GET HELP RIGHT AWAY IF:   Your knee pain does not stop.  Your knee pain does not get better.  Your knee joint feels hot to the touch.  You have a fever. MAKE SURE YOU:   Understand these instructions.  Will watch this condition.  Will get help right away if you are not doing well or get worse. Document Released: 06/27/2008 Document Revised: 06/23/2011 Document Reviewed: 06/27/2008 [facility] Patient Information 2015 [place], [place]. This information is not intended to replace advice given to you by your health care provider. Make sure you discuss any questions you have with your health care provider.  Pain Relief Preoperatively and Postoperatively Being a good patient does not mean being a silent one.If you have questions, problems, or concerns about the pain you may feel after surgery, let your caregiver know.Patients have the right to assessment and management of pain. The treatment of pain after surgery is important to speed up recovery and  return to normal activities. Severe pain after surgery, and the fear or anxiety associated with that pain, may cause extreme discomfort that:  Prevents sleep.  Decreases the ability to breathe deeply and cough. This can cause pneumonia or other upper airway infections.  Causes your heart to beat faster and your blood pressure to be higher.  Increases the risk for constipation and bloating.  Decreases the ability of wounds to heal.  May result in depression, increased anxiety, and feelings of helplessness. Relief of pain before surgery is also important because it will lessen the pain after surgery. Patients who receive both pain relief before and after surgery experience greater pain relief than those who only receive pain relief after surgery. Let your caregiver know if you are having uncontrolled pain.This is very important.Pain after surgery is more difficult to manage if it is permitted to become severe, so prompt and adequate treatment of acute pain is necessary. PAIN CONTROL METHODS Your caregivers follow policies and procedures about the management of patient pain.These guidelines should be explained to you before surgery.Plans for pain control after surgery must be mutually decided upon and instituted with your full understanding and agreement.Do not be afraid to ask questions regarding the care you are receiving.There are many different ways your caregivers will attempt to control your pain, including the following methods. As needed pain control  You may be given pain medicine either through your intravenous (IV) tube, or as a pill or liquid you can swallow. You will need to let your caregiver know when you are  having pain. Then, your caregiver will give you the pain medicine ordered for you.  Your pain medicine may make you constipated. If constipation occurs, drink more liquids if you can. Your caregiver may have you take a mild laxative. IV patient-controlled analgesia pump  (PCA pump)  You can get your pain medicine through the IV tube which goes into your vein. You are able to control the amount of pain medicine that you get. The pain medicine flows in through an IV tube and is controlled by a pump. This pump gives you a set amount of pain medicine when you push the button hooked up to it. Nobody should push this button but you or someone specifically assigned by you to do so. It is set up to keep you from accidentally giving yourself too much pain medicine. You will be able to start using your pain pump in the recovery room after your surgery. This method can be helpful for most types of surgery.  If you are still having too much pain, tell your caregiver. Also, tell your caregiver if you are feeling too sleepy or nauseous. Continuous epidural pain control  A thin, soft tube (catheter) is put into your back. Pain medicine flows through the catheter to lessen pain in the part of your body where the surgery is done. Continuous epidural pain control may work best for you if you are having surgery on your chest, abdomen, hip area, or legs. The epidural catheter is usually put into your back just before surgery. The catheter is left in until you can eat and take medicine by mouth. In most cases, this may take 2 to 3 days.  Giving pain medicine through the epidural catheter may help you heal faster because:  Your bowel gets back to normal faster.  You can get back to eating sooner.  You can be up and walking sooner. Medicine that numbs the area (local anesthetic)  You may receive an injection of pain medicine near where the pain is (local infiltration).  You may receive an injection of pain medicine near the nerve that controls the sensation to a specific part of the body (peripheral nerve block).  Medicine may be put in the spine to block pain (spinal block). Opioids  Moderate to moderately severe acute pain after surgery may respond to opioids.Opioids are  narcotic pain medicine. Opioids are often combined with non-narcotic medicines to improve pain relief, diminish the risk of side effects, and reduce the chance of addiction.  If you follow your caregiver's directions about taking opioids and you do not have a history of substance abuse, your risk of becoming addicted is exceptionally small.Opioids are given for short periods of time in careful doses to prevent addiction. Other methods of pain control include:  Steroids.  Physical therapy.  Heat and cold therapy.  Compression, such as wrapping an elastic bandage around the area of pain.  Massage. These various ways of controlling pain may be used together. Combining different methods of pain control is called multimodal analgesia. Using this approach has many benefits, including being able to eat, move around, and leave the hospital sooner. Document Released: 06/21/2002 Document Revised: 06/23/2011 Document Reviewed: 06/25/2010 [facility] Patient Information 2015 [place], [place]. This information is not intended to replace advice given to you by your health care provider. Make sure you discuss any questions you have with your health care provider.  Cryotherapy Cryotherapy is when you put ice on your injury. Ice helps lessen pain and puffiness (swelling) after  an injury. Ice works the best when you start using it in the first 24 to 48 hours after an injury. HOME CARE  Put a dry or damp towel between the ice pack and your skin.  You may press gently on the ice pack.  Leave the ice on for no more than 10 to 20 minutes at a time.  Check your skin after 5 minutes to make sure your skin is okay.  Rest at least 20 minutes between ice pack uses.  Stop using ice when your skin loses feeling (numbness).  Do not use ice on someone who cannot tell you when it hurts. This includes small children and people with memory problems (dementia). GET HELP RIGHT AWAY IF:  You have white spots on your  skin.  Your skin turns blue or pale.  Your skin feels waxy or hard.  Your puffiness gets worse. MAKE SURE YOU:   Understand these instructions.  Will watch your condition.  Will get help right away if you are not doing well or get worse. Document Released: 09/17/2007 Document Revised: 06/23/2011 Document Reviewed: 11/21/2010 [facility] Patient Information 2015 [place], [place]. This information is not intended to replace advice given to you by your health care provider. Make sure you discuss any questions you have with your health care provider.
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
## 2013-12-18 NOTE — ED Notes (Signed)
Pt from home with c/o left knee pain and swelling increasing last pm after having knee surgery this past Friday the 4th.  Pt states that the surgeon had to do more during the operation that he had originally expected to do.  Pt in NAD, A&O.

## 2013-12-21 NOTE — ED Provider Notes (Signed)
Medical screening examination/treatment/procedure(s) were conducted as a shared visit with non-physician practitioner(s) and myself.  I personally evaluated the patient during the encounter.   EKG Interpretation None      Overall well-appearing.  No significant signs of suggest infection of the left knee.  Incision sites look good.  No unilateral leg swelling to suggest DVT.  Outpatient orthopedic followup  Hoy Morn, MD 12/21/13 [phone]

## 2013-12-26 ENCOUNTER — Encounter (HOSPITAL_COMMUNITY): Payer: Self-pay | Admitting: Emergency Medicine

## 2013-12-26 ENCOUNTER — Emergency Department (HOSPITAL_COMMUNITY): Payer: Managed Care, Other (non HMO)

## 2013-12-26 ENCOUNTER — Emergency Department (HOSPITAL_COMMUNITY)
Admission: EM | Admit: 2013-12-26 | Discharge: 2013-12-26 | Disposition: A | Discharge status: Home / Self Care | Payer: Managed Care, Other (non HMO) | Attending: Emergency Medicine | Admitting: Emergency Medicine

## 2013-12-26 DIAGNOSIS — J4489 Other specified chronic obstructive pulmonary disease: Secondary | ICD-10-CM | POA: Insufficient documentation

## 2013-12-26 DIAGNOSIS — J449 Chronic obstructive pulmonary disease, unspecified: Secondary | ICD-10-CM | POA: Insufficient documentation

## 2013-12-26 DIAGNOSIS — F411 Generalized anxiety disorder: Secondary | ICD-10-CM | POA: Insufficient documentation

## 2013-12-26 DIAGNOSIS — Z8719 Personal history of other diseases of the digestive system: Secondary | ICD-10-CM | POA: Insufficient documentation

## 2013-12-26 DIAGNOSIS — Z8709 Personal history of other diseases of the respiratory system: Secondary | ICD-10-CM | POA: Diagnosis not present

## 2013-12-26 DIAGNOSIS — Z862 Personal history of diseases of the blood and blood-forming organs and certain disorders involving the immune mechanism: Secondary | ICD-10-CM | POA: Diagnosis not present

## 2013-12-26 DIAGNOSIS — Z860100 Personal history of colon polyps, unspecified: Secondary | ICD-10-CM | POA: Insufficient documentation

## 2013-12-26 DIAGNOSIS — Z79899 Other long term (current) drug therapy: Secondary | ICD-10-CM | POA: Insufficient documentation

## 2013-12-26 DIAGNOSIS — Y9389 Activity, other specified: Secondary | ICD-10-CM | POA: Insufficient documentation

## 2013-12-26 DIAGNOSIS — S8990XA Unspecified injury of unspecified lower leg, initial encounter: Secondary | ICD-10-CM | POA: Diagnosis present

## 2013-12-26 DIAGNOSIS — M129 Arthropathy, unspecified: Secondary | ICD-10-CM | POA: Insufficient documentation

## 2013-12-26 DIAGNOSIS — F17200 Nicotine dependence, unspecified, uncomplicated: Secondary | ICD-10-CM | POA: Diagnosis not present

## 2013-12-26 DIAGNOSIS — Z8669 Personal history of other diseases of the nervous system and sense organs: Secondary | ICD-10-CM | POA: Insufficient documentation

## 2013-12-26 DIAGNOSIS — Z8639 Personal history of other endocrine, nutritional and metabolic disease: Secondary | ICD-10-CM | POA: Insufficient documentation

## 2013-12-26 DIAGNOSIS — S99919A Unspecified injury of unspecified ankle, initial encounter: Principal | ICD-10-CM

## 2013-12-26 DIAGNOSIS — W108XXA Fall (on) (from) other stairs and steps, initial encounter: Secondary | ICD-10-CM | POA: Diagnosis not present

## 2013-12-26 DIAGNOSIS — I10 Essential (primary) hypertension: Secondary | ICD-10-CM | POA: Insufficient documentation

## 2013-12-26 DIAGNOSIS — Z7982 Long term (current) use of aspirin: Secondary | ICD-10-CM | POA: Insufficient documentation

## 2013-12-26 DIAGNOSIS — Y9289 Other specified places as the place of occurrence of the external cause: Secondary | ICD-10-CM | POA: Insufficient documentation

## 2013-12-26 DIAGNOSIS — M25562 Pain in left knee: Secondary | ICD-10-CM

## 2013-12-26 DIAGNOSIS — S99929A Unspecified injury of unspecified foot, initial encounter: Principal | ICD-10-CM

## 2013-12-26 MED ORDER — KETOROLAC TROMETHAMINE 60 MG/2ML IM SOLN
60.0000 mg | Freq: Once | INTRAMUSCULAR | Status: AC
  Administered 2013-12-26: 60 mg via INTRAMUSCULAR
  Filled 2013-12-26: qty 2

## 2013-12-26 MED ORDER — OXYCODONE-ACETAMINOPHEN 5-325 MG PO TABS: 1.0000 | ORAL_TABLET | ORAL | Status: DC | PRN

## 2013-12-26 MED ORDER — OXYCODONE-ACETAMINOPHEN 5-325 MG PO TABS
2.0000 | ORAL_TABLET | Freq: Once | ORAL | Status: AC
  Administered 2013-12-26: 2 via ORAL
  Filled 2013-12-26: qty 2

## 2013-12-26 MED ORDER — KETOROLAC TROMETHAMINE 30 MG/ML IJ SOLN: 30.0000 mg | Freq: Once | INTRAMUSCULAR | Status: DC

## 2013-12-26 NOTE — ED Notes (Signed)
When giving pt toradol injection, pt states "toradol is an antiinflammatory, why can't I have dilaudid?" Informed patient that we would start with the toradol and go from there if not effective. NP notified.
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
## 2013-12-26 NOTE — ED Notes (Signed)
The pts lt knee is painful  Since Friday.Peter Vang  He had knee surgery on=e week ago abd he fell Friday night since then he has had more pain

## 2013-12-26 NOTE — Discharge Instructions (Signed)
Please follow directions provided.  Be sure to follow up with Dr. Gladstone Lighter regarding this pain.  You may take ibuprofen 600 mg by mouth every 6-8 hours for mild to moderate pain. The percocet is available for more severe pain.  Do not drive when taking this medicine.    SEEK MEDICAL CARE IF:  You have knee pain that is continual and does not seem to be getting better.   SEEK IMMEDIATE MEDICAL CARE IF:  Your knee joint feels hot to the touch and you have a high fever, your toes are numb or tingling, appear gray or blue,

## 2013-12-27 ENCOUNTER — Other Ambulatory Visit: Payer: Self-pay | Admitting: Internal Medicine

## 2013-12-27 NOTE — Telephone Encounter (Signed)
Last office visit 11/30/2013

## 2013-12-27 NOTE — Telephone Encounter (Signed)
OK X1 

## 2013-12-29 NOTE — ED Provider Notes (Signed)
I was available for consultation during the completion of this patient's course.  The initial evaluation was supervised by another attending physician.  Carmin Muskrat, MD 12/29/13 [phone]

## 2014-01-24 ENCOUNTER — Other Ambulatory Visit: Payer: Self-pay | Admitting: Internal Medicine

## 2014-01-24 NOTE — Telephone Encounter (Signed)
8.19.15 last office visit  9.15.15 med last phoned to pharmacy

## 2014-01-24 NOTE — Telephone Encounter (Signed)
Alprazolam has been called to [facility] on [address]

## 2014-01-24 NOTE — Telephone Encounter (Signed)
OK X1 

## 2014-02-02 NOTE — Progress Notes (Signed)
Please put orders in Epic surgery 02-15-14 pre op 02-07-14 Thanks

## 2014-02-06 NOTE — Patient Instructions (Addendum)
Peter Vang  02/06/2014   Your procedure is scheduled on:  02/15/2014    Come thru the [place] Entrance   Follow the Signs to [facility] at  0630      am  Call this number if you have problems the morning of surgery: [phone]   Remember:   Do not eat food or drink liquids after midnight.   Take these medicines the morning of surgery with A SIP OF WATER: Norvasc( Amlodipine), Amlodipine-Atorvastatin, Wellbutrin, Percocet    Do not wear jewelry,   Do not wear lotions, powders, or perfumes.  deodorant.  . Men may shave face and neck.  Do not bring valuables to the hospital.  Contacts, dentures or bridgework may not be worn into surgery.  Leave suitcase in the car. After surgery it may be brought to your room.  For patients admitted to the hospital, checkout time is 11:00 AM the day of  discharge.      [place] - Preparing for Surgery Before surgery, you can play an important role.  Because skin is not sterile, your skin needs to be as free of germs as possible.  You can reduce the number of germs on your skin by washing with CHG (chlorahexidine gluconate) soap before surgery.  CHG is an antiseptic cleaner which kills germs and bonds with the skin to continue killing germs even after washing. Please DO NOT use if you have an allergy to CHG or antibacterial soaps.  If your skin becomes reddened/irritated stop using the CHG and inform your nurse when you arrive at Short Stay. Do not shave (including legs and underarms) for at least 48 hours prior to the first CHG shower.  You may shave your face/neck. Please follow these instructions carefully:  1.  Shower with CHG Soap the night before surgery and the  morning of Surgery.  2.  If you choose to wash your hair, wash your hair first as usual with your  normal  shampoo.  3.  After you shampoo, rinse your hair and body thoroughly to remove the  shampoo.                           4.  Use CHG as you would any other liquid soap.   You can apply chg directly  to the skin and wash                       Gently with a scrungie or clean washcloth.  5.  Apply the CHG Soap to your body ONLY FROM THE NECK DOWN.   Do not use on face/ open                           Wound or open sores. Avoid contact with eyes, ears mouth and genitals (private parts).                       Wash face,  Genitals (private parts) with your normal soap.             6.  Wash thoroughly, paying special attention to the area where your surgery  will be performed.  7.  Thoroughly rinse your body with warm water from the neck down.  8.  DO NOT shower/wash with your normal soap after using and rinsing off  the CHG Soap.  9.  Pat yourself dry with a clean towel.            10.  Wear clean pajamas.            11.  Place clean sheets on your bed the night of your first shower and do not  sleep with pets. Day of Surgery : Do not apply any lotions/deodorants the morning of surgery.  Please wear clean clothes to the hospital/surgery center.  FAILURE TO FOLLOW THESE INSTRUCTIONS MAY RESULT IN THE CANCELLATION OF YOUR SURGERY PATIENT SIGNATURE_________________________________  NURSE SIGNATURE__________________________________  ________________________________________________________________________  WHAT IS A BLOOD TRANSFUSION? Blood Transfusion Information  A transfusion is the replacement of blood or some of its parts. Blood is made up of multiple cells which provide different functions.  Red blood cells carry oxygen and are used for blood loss replacement.  White blood cells fight against infection.  Platelets control bleeding.  Plasma helps clot blood.  Other blood products are available for specialized needs, such as hemophilia or other clotting disorders. BEFORE THE TRANSFUSION  Who gives blood for transfusions?   Healthy volunteers who are fully evaluated to make sure their blood is safe. This is blood bank blood. Transfusion  therapy is the safest it has ever been in the practice of medicine. Before blood is taken from a donor, a complete history is taken to make sure that person has no history of diseases nor engages in risky social behavior (examples are intravenous drug use or sexual activity with multiple partners). The donor's travel history is screened to minimize risk of transmitting infections, such as malaria. The donated blood is tested for signs of infectious diseases, such as HIV and hepatitis. The blood is then tested to be sure it is compatible with you in order to minimize the chance of a transfusion reaction. If you or a relative donates blood, this is often done in anticipation of surgery and is not appropriate for emergency situations. It takes many days to process the donated blood. RISKS AND COMPLICATIONS Although transfusion therapy is very safe and saves many lives, the main dangers of transfusion include:   Getting an infectious disease.  Developing a transfusion reaction. This is an allergic reaction to something in the blood you were given. Every precaution is taken to prevent this. The decision to have a blood transfusion has been considered carefully by your caregiver before blood is given. Blood is not given unless the benefits outweigh the risks. AFTER THE TRANSFUSION  Right after receiving a blood transfusion, you will usually feel much better and more energetic. This is especially true if your red blood cells have gotten low (anemic). The transfusion raises the level of the red blood cells which carry oxygen, and this usually causes an energy increase.  The nurse administering the transfusion will monitor you carefully for complications. HOME CARE INSTRUCTIONS  No special instructions are needed after a transfusion. You may find your energy is better. Speak with your caregiver about any limitations on activity for underlying diseases you may have. SEEK MEDICAL CARE IF:   Your condition is  not improving after your transfusion.  You develop redness or irritation at the intravenous (IV) site. SEEK IMMEDIATE MEDICAL CARE IF:  Any of the following symptoms occur over the next 12 hours:  Shaking chills.  You have a temperature by mouth above 102 F (38.9 C), not controlled by medicine.  Chest, back, or muscle pain.  People around you feel you are not acting correctly or  are confused.  Shortness of breath or difficulty breathing.  Dizziness and fainting.  You get a rash or develop hives.  You have a decrease in urine output.  Your urine turns a dark color or changes to pink, red, or brown. Any of the following symptoms occur over the next 10 days:  You have a temperature by mouth above 102 F (38.9 C), not controlled by medicine.  Shortness of breath.  Weakness after normal activity.  The white part of the eye turns yellow (jaundice).  You have a decrease in the amount of urine or are urinating less often.  Your urine turns a dark color or changes to pink, red, or brown. Document Released: 03/28/2000 Document Revised: 06/23/2011 Document Reviewed: 11/15/2007 ExitCare Patient Information 2014 [place].  _______________________________________________________________________  Incentive Spirometer  An incentive spirometer is a tool that can help keep your lungs clear and active. This tool measures how well you are filling your lungs with each breath. Taking long deep breaths may help reverse or decrease the chance of developing breathing (pulmonary) problems (especially infection) following:  A long period of time when you are unable to move or be active. BEFORE THE PROCEDURE   If the spirometer includes an indicator to show your best effort, your nurse or respiratory therapist will set it to a desired goal.  If possible, sit up straight or lean slightly forward. Try not to slouch.  Hold the incentive spirometer in an upright position. INSTRUCTIONS  FOR USE  1. Sit on the edge of your bed if possible, or sit up as far as you can in bed or on a chair. 2. Hold the incentive spirometer in an upright position. 3. Breathe out normally. 4. Place the mouthpiece in your mouth and seal your lips tightly around it. 5. Breathe in slowly and as deeply as possible, raising the piston or the ball toward the top of the column. 6. Hold your breath for 3-5 seconds or for as long as possible. Allow the piston or ball to fall to the bottom of the column. 7. Remove the mouthpiece from your mouth and breathe out normally. 8. Rest for a few seconds and repeat Steps 1 through 7 at least 10 times every 1-2 hours when you are awake. Take your time and take a few normal breaths between deep breaths. 9. The spirometer may include an indicator to show your best effort. Use the indicator as a goal to work toward during each repetition. 10. After each set of 10 deep breaths, practice coughing to be sure your lungs are clear. If you have an incision (the cut made at the time of surgery), support your incision when coughing by placing a pillow or rolled up towels firmly against it. Once you are able to get out of bed, walk around indoors and cough well. You may stop using the incentive spirometer when instructed by your caregiver.  RISKS AND COMPLICATIONS  Take your time so you do not get dizzy or light-headed.  If you are in pain, you may need to take or ask for pain medication before doing incentive spirometry. It is harder to take a deep breath if you are having pain. AFTER USE  Rest and breathe slowly and easily.  It can be helpful to keep track of a log of your progress. Your caregiver can provide you with a simple table to help with this. If you are using the spirometer at home, follow these instructions: [place] IF:  You are having difficultly using the spirometer.  You have trouble using the spirometer as often as instructed.  Your pain  medication is not giving enough relief while using the spirometer.  You develop fever of 100.5 F (38.1 C) or higher. SEEK IMMEDIATE MEDICAL CARE IF:   You cough up bloody sputum that had not been present before.  You develop fever of 102 F (38.9 C) or greater.  You develop worsening pain at or near the incision site. MAKE SURE YOU:   Understand these instructions.  Will watch your condition.  Will get help right away if you are not doing well or get worse. Document Released: 08/11/2006 Document Revised: 06/23/2011 Document Reviewed: 10/12/2006 ExitCare Patient Information 2014 ExitCare, [place].   ________________________________________________________________________    Please read over the following fact sheets that you were given: MRSA Information, coughing and deep breathing exercises, leg exercises

## 2014-02-06 NOTE — Progress Notes (Signed)
Surgery on 02/15/2014.  preop on 02/07/2014 at 1030am  Need orders in EPIc.  Thank You.

## 2014-02-07 ENCOUNTER — Encounter (HOSPITAL_COMMUNITY)
Admission: RE | Admit: 2014-02-07 | Discharge: 2014-02-07 | Disposition: A | Discharge status: Home / Self Care | Payer: Managed Care, Other (non HMO) | Source: Ambulatory Visit | Attending: Orthopedic Surgery | Admitting: Orthopedic Surgery

## 2014-02-07 ENCOUNTER — Encounter (HOSPITAL_COMMUNITY): Payer: Self-pay

## 2014-02-07 ENCOUNTER — Encounter (HOSPITAL_COMMUNITY): Payer: Self-pay | Admitting: Pharmacy Technician

## 2014-02-07 ENCOUNTER — Ambulatory Visit (HOSPITAL_COMMUNITY)
Admission: RE | Admit: 2014-02-07 | Discharge: 2014-02-07 | Disposition: A | Discharge status: Home / Self Care | Payer: Managed Care, Other (non HMO) | Source: Ambulatory Visit | Attending: Orthopedic Surgery | Admitting: Orthopedic Surgery

## 2014-02-07 DIAGNOSIS — J449 Chronic obstructive pulmonary disease, unspecified: Secondary | ICD-10-CM | POA: Diagnosis present

## 2014-02-07 DIAGNOSIS — I10 Essential (primary) hypertension: Secondary | ICD-10-CM | POA: Insufficient documentation

## 2014-02-07 LAB — BASIC METABOLIC PANEL
Anion gap: 11 (ref 5–15)
BUN: 19 mg/dL (ref 6–23)
CALCIUM: 9.7 mg/dL (ref 8.4–10.5)
CHLORIDE: 100 meq/L (ref 96–112)
CO2: 29 mEq/L (ref 19–32)
Creatinine, Ser: 1.18 mg/dL (ref 0.50–1.35)
GFR calc Af Amer: 82 mL/min — ABNORMAL LOW (ref >90)
GFR calc non Af Amer: 71 mL/min — ABNORMAL LOW (ref >90)
GLUCOSE: 97 mg/dL (ref 70–99)
Potassium: 4.5 mEq/L (ref 3.7–5.3)
SODIUM: 140 meq/L (ref 137–147)

## 2014-02-07 LAB — URINALYSIS, ROUTINE W REFLEX MICROSCOPIC
Bilirubin Urine: NEGATIVE
GLUCOSE, UA: 100 mg/dL — AB
Hgb urine dipstick: NEGATIVE
Ketones, ur: NEGATIVE mg/dL
Leukocytes, UA: NEGATIVE
NITRITE: NEGATIVE
PROTEIN: NEGATIVE mg/dL
Specific Gravity, Urine: 1.024 (ref 1.005–1.030)
UROBILINOGEN UA: 0.2 mg/dL (ref 0.0–1.0)
pH: 6 (ref 5.0–8.0)

## 2014-02-07 LAB — CBC
HCT: 42.4 % (ref 39.0–52.0)
Hemoglobin: 15 g/dL (ref 13.0–17.0)
MCH: 33.1 pg (ref 26.0–34.0)
MCHC: 35.4 g/dL (ref 30.0–36.0)
MCV: 93.6 fL (ref 78.0–100.0)
Platelets: 298 10*3/uL (ref 150–400)
RBC: 4.53 MIL/uL (ref 4.22–5.81)
RDW: 11.8 % (ref 11.5–15.5)
WBC: 8.5 10*3/uL (ref 4.0–10.5)

## 2014-02-07 LAB — PROTIME-INR
INR: 1.04 (ref 0.00–1.49)
PROTHROMBIN TIME: 13.8 s (ref 11.6–15.2)

## 2014-02-07 LAB — APTT: APTT: 31 s (ref 24–37)

## 2014-02-07 LAB — SURGICAL PCR SCREEN
MRSA, PCR: NEGATIVE
Staphylococcus aureus: NEGATIVE

## 2014-02-07 NOTE — Progress Notes (Signed)
Urinalysis with micro results faxed via EPIC to Dr Gladstone Lighter.

## 2014-02-07 NOTE — Progress Notes (Signed)
EKG- 03/30/13 EPIC  CXR- 02/07/14 EPIC

## 2014-02-08 ENCOUNTER — Other Ambulatory Visit: Payer: Self-pay | Admitting: Surgical

## 2014-02-08 MED ORDER — TRANEXAMIC ACID 100 MG/ML IV SOLN: 1000.0000 mg | INTRAVENOUS | Status: AC
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
## 2014-02-14 ENCOUNTER — Other Ambulatory Visit: Payer: Self-pay | Admitting: Surgical

## 2014-02-15 ENCOUNTER — Inpatient Hospital Stay (HOSPITAL_COMMUNITY)
Admission: RE | Admit: 2014-02-15 | Discharge: 2014-02-17 | DRG: 470 | Disposition: A | Discharge status: Home Health | Payer: Managed Care, Other (non HMO) | Source: Ambulatory Visit | Attending: Orthopedic Surgery | Admitting: Orthopedic Surgery

## 2014-02-15 ENCOUNTER — Inpatient Hospital Stay (HOSPITAL_COMMUNITY): Payer: Managed Care, Other (non HMO) | Admitting: Anesthesiology

## 2014-02-15 ENCOUNTER — Encounter (HOSPITAL_COMMUNITY)
Admission: RE | Disposition: A | Discharge status: Home Health | Payer: Self-pay | Source: Ambulatory Visit | Attending: Orthopedic Surgery

## 2014-02-15 ENCOUNTER — Encounter (HOSPITAL_COMMUNITY): Payer: Self-pay | Admitting: *Deleted

## 2014-02-15 DIAGNOSIS — K219 Gastro-esophageal reflux disease without esophagitis: Secondary | ICD-10-CM | POA: Diagnosis present

## 2014-02-15 DIAGNOSIS — I7300 Raynaud's syndrome without gangrene: Secondary | ICD-10-CM | POA: Diagnosis present

## 2014-02-15 DIAGNOSIS — F17210 Nicotine dependence, cigarettes, uncomplicated: Secondary | ICD-10-CM | POA: Diagnosis present

## 2014-02-15 DIAGNOSIS — Z6828 Body mass index (BMI) 28.0-28.9, adult: Secondary | ICD-10-CM | POA: Diagnosis not present

## 2014-02-15 DIAGNOSIS — M1712 Unilateral primary osteoarthritis, left knee: Principal | ICD-10-CM | POA: Diagnosis present

## 2014-02-15 DIAGNOSIS — G894 Chronic pain syndrome: Secondary | ICD-10-CM | POA: Diagnosis present

## 2014-02-15 DIAGNOSIS — M24562 Contracture, left knee: Secondary | ICD-10-CM | POA: Diagnosis present

## 2014-02-15 DIAGNOSIS — J449 Chronic obstructive pulmonary disease, unspecified: Secondary | ICD-10-CM | POA: Diagnosis present

## 2014-02-15 DIAGNOSIS — I10 Essential (primary) hypertension: Secondary | ICD-10-CM | POA: Diagnosis present

## 2014-02-15 DIAGNOSIS — Z79899 Other long term (current) drug therapy: Secondary | ICD-10-CM | POA: Diagnosis not present

## 2014-02-15 DIAGNOSIS — E782 Mixed hyperlipidemia: Secondary | ICD-10-CM | POA: Diagnosis present

## 2014-02-15 DIAGNOSIS — Z96612 Presence of left artificial shoulder joint: Secondary | ICD-10-CM | POA: Diagnosis present

## 2014-02-15 DIAGNOSIS — F411 Generalized anxiety disorder: Secondary | ICD-10-CM | POA: Diagnosis present

## 2014-02-15 DIAGNOSIS — Z96611 Presence of right artificial shoulder joint: Secondary | ICD-10-CM | POA: Diagnosis present

## 2014-02-15 DIAGNOSIS — G2581 Restless legs syndrome: Secondary | ICD-10-CM | POA: Diagnosis present

## 2014-02-15 DIAGNOSIS — M25562 Pain in left knee: Secondary | ICD-10-CM | POA: Diagnosis present

## 2014-02-15 DIAGNOSIS — Z96659 Presence of unspecified artificial knee joint: Secondary | ICD-10-CM

## 2014-02-15 HISTORY — PX: TOTAL KNEE ARTHROPLASTY: SHX125

## 2014-02-15 LAB — TYPE AND SCREEN
ABO/RH(D): A POS
Antibody Screen: NEGATIVE

## 2014-02-15 LAB — ABO/RH: ABO/RH(D): A POS

## 2014-02-15 SURGERY — ARTHROPLASTY, KNEE, TOTAL
Anesthesia: General | Site: Knee | Laterality: Left

## 2014-02-15 MED ORDER — LORAZEPAM 2 MG/ML IJ SOLN
1.0000 mg | Freq: Three times a day (TID) | INTRAMUSCULAR | Status: DC | PRN
  Administered 2014-02-15 – 2014-02-16 (×2): 1 mg via INTRAVENOUS
  Filled 2014-02-15 (×2): qty 1

## 2014-02-15 MED ORDER — NICOTINE 14 MG/24HR TD PT24
14.0000 mg | MEDICATED_PATCH | Freq: Every day | TRANSDERMAL | Status: DC
Start: 2014-02-15 — End: 2014-02-17
  Administered 2014-02-15 – 2014-02-17 (×3): 14 mg via TRANSDERMAL
  Filled 2014-02-15 (×3): qty 1

## 2014-02-15 MED ORDER — PROPOFOL 10 MG/ML IV BOLUS
INTRAVENOUS | Status: DC | PRN
  Administered 2014-02-15: 200 mg via INTRAVENOUS
  Administered 2014-02-15: 50 mg via INTRAVENOUS

## 2014-02-15 MED ORDER — BUPIVACAINE HCL (PF) 0.25 % IJ SOLN
INTRAMUSCULAR | Status: AC
  Filled 2014-02-15: qty 30

## 2014-02-15 MED ORDER — BUPROPION HCL 75 MG PO TABS
75.0000 mg | ORAL_TABLET | Freq: Two times a day (BID) | ORAL | Status: DC
  Administered 2014-02-15 – 2014-02-17 (×4): 75 mg via ORAL
  Filled 2014-02-15 (×6): qty 1

## 2014-02-15 MED ORDER — HYDROMORPHONE HCL 1 MG/ML IJ SOLN
0.5000 mg | INTRAMUSCULAR | Status: DC | PRN
  Administered 2014-02-15 (×3): 0.5 mg via INTRAVENOUS

## 2014-02-15 MED ORDER — SODIUM CHLORIDE 0.9 % IR SOLN
Status: DC | PRN
  Administered 2014-02-15: 500 mL

## 2014-02-15 MED ORDER — ONDANSETRON HCL 4 MG PO TABS: 4.0000 mg | ORAL_TABLET | Freq: Four times a day (QID) | ORAL | Status: DC | PRN

## 2014-02-15 MED ORDER — LABETALOL HCL 5 MG/ML IV SOLN
INTRAVENOUS | Status: AC
  Filled 2014-02-15: qty 4

## 2014-02-15 MED ORDER — METHOCARBAMOL 500 MG PO TABS
500.0000 mg | ORAL_TABLET | Freq: Four times a day (QID) | ORAL | Status: DC | PRN
  Administered 2014-02-15 – 2014-02-17 (×6): 500 mg via ORAL
  Filled 2014-02-15 (×6): qty 1

## 2014-02-15 MED ORDER — ACETAMINOPHEN 10 MG/ML IV SOLN
1000.0000 mg | Freq: Once | INTRAVENOUS | Status: AC
  Administered 2014-02-15: 1000 mg via INTRAVENOUS
  Filled 2014-02-15: qty 100

## 2014-02-15 MED ORDER — ACETAMINOPHEN 650 MG RE SUPP
650.0000 mg | Freq: Four times a day (QID) | RECTAL | Status: DC | PRN
Start: 2014-02-15 — End: 2014-02-17

## 2014-02-15 MED ORDER — PROPOFOL 10 MG/ML IV BOLUS
INTRAVENOUS | Status: AC
  Filled 2014-02-15: qty 20

## 2014-02-15 MED ORDER — HYDROMORPHONE HCL 1 MG/ML IJ SOLN
1.0000 mg | INTRAMUSCULAR | Status: DC | PRN
  Administered 2014-02-15 – 2014-02-17 (×15): 2 mg via INTRAVENOUS
  Filled 2014-02-15 (×17): qty 2

## 2014-02-15 MED ORDER — SODIUM CHLORIDE 0.9 % IJ SOLN
INTRAMUSCULAR | Status: AC
  Filled 2014-02-15: qty 50

## 2014-02-15 MED ORDER — TRANEXAMIC ACID 100 MG/ML IV SOLN
1000.0000 mg | INTRAVENOUS | Status: AC
  Administered 2014-02-15: 1000 mg via INTRAVENOUS
  Filled 2014-02-15: qty 10

## 2014-02-15 MED ORDER — KETAMINE HCL 10 MG/ML IJ SOLN
INTRAMUSCULAR | Status: AC
  Filled 2014-02-15: qty 1

## 2014-02-15 MED ORDER — DEXAMETHASONE SODIUM PHOSPHATE 10 MG/ML IJ SOLN
INTRAMUSCULAR | Status: AC
  Filled 2014-02-15: qty 1

## 2014-02-15 MED ORDER — HYDROMORPHONE HCL 1 MG/ML IJ SOLN
0.2500 mg | INTRAMUSCULAR | Status: DC | PRN
  Administered 2014-02-15 (×4): 0.5 mg via INTRAVENOUS

## 2014-02-15 MED ORDER — CEFAZOLIN SODIUM-DEXTROSE 2-3 GM-% IV SOLR
INTRAVENOUS | Status: AC
  Filled 2014-02-15: qty 50

## 2014-02-15 MED ORDER — HYDRALAZINE HCL 20 MG/ML IJ SOLN
INTRAMUSCULAR | Status: AC
  Filled 2014-02-15: qty 1

## 2014-02-15 MED ORDER — FENTANYL CITRATE 0.05 MG/ML IJ SOLN
INTRAMUSCULAR | Status: DC | PRN
  Administered 2014-02-15 (×13): 50 ug via INTRAVENOUS

## 2014-02-15 MED ORDER — ATORVASTATIN CALCIUM 40 MG PO TABS: 40.0000 mg | ORAL_TABLET | Freq: Every day | ORAL | Status: DC

## 2014-02-15 MED ORDER — CEFAZOLIN SODIUM-DEXTROSE 2-3 GM-% IV SOLR
2.0000 g | INTRAVENOUS | Status: AC
Start: 2014-02-15 — End: 2014-02-15
  Administered 2014-02-15: 2 g via INTRAVENOUS

## 2014-02-15 MED ORDER — KETOROLAC TROMETHAMINE 30 MG/ML IJ SOLN
INTRAMUSCULAR | Status: DC | PRN
  Administered 2014-02-15: 30 mg via INTRAVENOUS

## 2014-02-15 MED ORDER — SUCCINYLCHOLINE CHLORIDE 20 MG/ML IJ SOLN
INTRAMUSCULAR | Status: DC | PRN
  Administered 2014-02-15: 100 mg via INTRAVENOUS

## 2014-02-15 MED ORDER — OXYCODONE HCL 5 MG PO TABS: 5.0000 mg | ORAL_TABLET | ORAL | Status: DC | PRN

## 2014-02-15 MED ORDER — FENTANYL CITRATE 0.05 MG/ML IJ SOLN
INTRAMUSCULAR | Status: AC
  Filled 2014-02-15: qty 2

## 2014-02-15 MED ORDER — ALPRAZOLAM 1 MG PO TABS
1.0000 mg | ORAL_TABLET | Freq: Two times a day (BID) | ORAL | Status: DC
  Administered 2014-02-15 – 2014-02-17 (×4): 1 mg via ORAL
  Filled 2014-02-15 (×4): qty 1

## 2014-02-15 MED ORDER — ACETAMINOPHEN 325 MG PO TABS
650.0000 mg | ORAL_TABLET | Freq: Four times a day (QID) | ORAL | Status: DC | PRN
  Administered 2014-02-15 – 2014-02-16 (×2): 650 mg via ORAL
  Filled 2014-02-15 (×2): qty 2

## 2014-02-15 MED ORDER — THROMBIN 5000 UNITS EX SOLR
CUTANEOUS | Status: AC
  Filled 2014-02-15: qty 5000

## 2014-02-15 MED ORDER — LABETALOL HCL 5 MG/ML IV SOLN
INTRAVENOUS | Status: DC | PRN
Start: 2014-02-15 — End: 2014-02-15
  Administered 2014-02-15: 5 mg via INTRAVENOUS

## 2014-02-15 MED ORDER — ALUM & MAG HYDROXIDE-SIMETH 200-200-20 MG/5ML PO SUSP: 30.0000 mL | ORAL | Status: DC | PRN

## 2014-02-15 MED ORDER — HYDROMORPHONE HCL 1 MG/ML IJ SOLN
INTRAMUSCULAR | Status: DC | PRN
  Administered 2014-02-15 (×4): 0.5 mg via INTRAVENOUS

## 2014-02-15 MED ORDER — LOSARTAN POTASSIUM 50 MG PO TABS
100.0000 mg | ORAL_TABLET | Freq: Every day | ORAL | Status: DC
  Administered 2014-02-16 – 2014-02-17 (×2): 100 mg via ORAL
  Filled 2014-02-15 (×2): qty 2

## 2014-02-15 MED ORDER — ONDANSETRON HCL 4 MG/2ML IJ SOLN
INTRAMUSCULAR | Status: AC
  Filled 2014-02-15: qty 2

## 2014-02-15 MED ORDER — HYDROMORPHONE HCL 1 MG/ML IJ SOLN
INTRAMUSCULAR | Status: AC
  Administered 2014-02-15: 1 mg
  Filled 2014-02-15: qty 1

## 2014-02-15 MED ORDER — RIVAROXABAN 10 MG PO TABS
10.0000 mg | ORAL_TABLET | Freq: Every day | ORAL | Status: DC
  Administered 2014-02-16 – 2014-02-17 (×2): 10 mg via ORAL
  Filled 2014-02-15 (×3): qty 1

## 2014-02-15 MED ORDER — BISACODYL 5 MG PO TBEC: 5.0000 mg | DELAYED_RELEASE_TABLET | Freq: Every day | ORAL | Status: DC | PRN

## 2014-02-15 MED ORDER — DEXAMETHASONE SODIUM PHOSPHATE 10 MG/ML IJ SOLN
INTRAMUSCULAR | Status: DC | PRN
  Administered 2014-02-15: 10 mg via INTRAVENOUS

## 2014-02-15 MED ORDER — KETAMINE HCL 10 MG/ML IJ SOLN
INTRAMUSCULAR | Status: DC | PRN
  Administered 2014-02-15: 30 mg via INTRAVENOUS
  Administered 2014-02-15 (×2): 10 mg via INTRAVENOUS

## 2014-02-15 MED ORDER — BUPIVACAINE LIPOSOME 1.3 % IJ SUSP
20.0000 mL | Freq: Once | INTRAMUSCULAR | Status: AC
  Administered 2014-02-15: 20 mL
  Filled 2014-02-15: qty 20

## 2014-02-15 MED ORDER — PHENOL 1.4 % MT LIQD
1.0000 | OROMUCOSAL | Status: DC | PRN
  Filled 2014-02-15: qty 177

## 2014-02-15 MED ORDER — HYDROMORPHONE HCL 1 MG/ML IJ SOLN
INTRAMUSCULAR | Status: AC
  Administered 2014-02-15: 12:00:00
  Filled 2014-02-15: qty 1

## 2014-02-15 MED ORDER — FENTANYL CITRATE 0.05 MG/ML IJ SOLN
INTRAMUSCULAR | Status: AC
  Filled 2014-02-15: qty 5

## 2014-02-15 MED ORDER — BUPIVACAINE HCL (PF) 0.25 % IJ SOLN
INTRAMUSCULAR | Status: DC | PRN
  Administered 2014-02-15: 10 mL

## 2014-02-15 MED ORDER — AMLODIPINE BESYLATE 5 MG PO TABS
5.0000 mg | ORAL_TABLET | Freq: Every day | ORAL | Status: DC
  Administered 2014-02-16 – 2014-02-17 (×2): 5 mg via ORAL
  Filled 2014-02-15 (×2): qty 1

## 2014-02-15 MED ORDER — METOPROLOL TARTRATE 1 MG/ML IV SOLN
INTRAVENOUS | Status: AC
  Filled 2014-02-15: qty 5

## 2014-02-15 MED ORDER — HYDRALAZINE HCL 20 MG/ML IJ SOLN
INTRAMUSCULAR | Status: DC | PRN
  Administered 2014-02-15 (×2): 2.5 mg via INTRAVENOUS

## 2014-02-15 MED ORDER — THROMBIN 5000 UNITS EX SOLR
OROMUCOSAL | Status: DC | PRN
  Administered 2014-02-15: 5 mL via TOPICAL

## 2014-02-15 MED ORDER — CELECOXIB 200 MG PO CAPS
200.0000 mg | ORAL_CAPSULE | Freq: Two times a day (BID) | ORAL | Status: DC
  Administered 2014-02-15 – 2014-02-17 (×4): 200 mg via ORAL
  Filled 2014-02-15 (×6): qty 1

## 2014-02-15 MED ORDER — OXYCODONE-ACETAMINOPHEN 5-325 MG PO TABS
1.0000 | ORAL_TABLET | Freq: Once | ORAL | Status: AC
  Administered 2014-02-15: 1 via ORAL
  Filled 2014-02-15: qty 1

## 2014-02-15 MED ORDER — LACTATED RINGERS IV SOLN
INTRAVENOUS | Status: DC
  Administered 2014-02-15: 1000 mL via INTRAVENOUS

## 2014-02-15 MED ORDER — ATORVASTATIN CALCIUM 40 MG PO TABS
40.0000 mg | ORAL_TABLET | ORAL | Status: DC
  Filled 2014-02-15 (×2): qty 1

## 2014-02-15 MED ORDER — OXYCODONE HCL 5 MG PO TABS
5.0000 mg | ORAL_TABLET | ORAL | Status: DC | PRN
  Administered 2014-02-15: 15 mg via ORAL
  Administered 2014-02-15 – 2014-02-17 (×13): 20 mg via ORAL
  Filled 2014-02-15 (×5): qty 4
  Filled 2014-02-15: qty 3
  Filled 2014-02-15 (×9): qty 4

## 2014-02-15 MED ORDER — METOPROLOL TARTRATE 1 MG/ML IV SOLN
2.5000 mg | INTRAVENOUS | Status: AC
  Administered 2014-02-15 (×3): 2.5 mg via INTRAVENOUS

## 2014-02-15 MED ORDER — LACTATED RINGERS IV SOLN: INTRAVENOUS | Status: DC

## 2014-02-15 MED ORDER — MENTHOL 3 MG MT LOZG
1.0000 | LOZENGE | OROMUCOSAL | Status: DC | PRN
  Filled 2014-02-15: qty 9

## 2014-02-15 MED ORDER — MIDAZOLAM HCL 5 MG/5ML IJ SOLN
INTRAMUSCULAR | Status: DC | PRN
  Administered 2014-02-15 (×2): 1 mg via INTRAVENOUS
  Administered 2014-02-15: 2 mg via INTRAVENOUS

## 2014-02-15 MED ORDER — METHOCARBAMOL 1000 MG/10ML IJ SOLN
500.0000 mg | Freq: Four times a day (QID) | INTRAVENOUS | Status: DC | PRN
  Administered 2014-02-15: 500 mg via INTRAVENOUS
  Filled 2014-02-15 (×2): qty 5

## 2014-02-15 MED ORDER — MIDAZOLAM HCL 2 MG/2ML IJ SOLN
INTRAMUSCULAR | Status: AC
  Filled 2014-02-15: qty 2

## 2014-02-15 MED ORDER — FERROUS SULFATE 325 (65 FE) MG PO TABS
325.0000 mg | ORAL_TABLET | Freq: Three times a day (TID) | ORAL | Status: DC
  Administered 2014-02-15 – 2014-02-17 (×4): 325 mg via ORAL
  Filled 2014-02-15 (×9): qty 1

## 2014-02-15 MED ORDER — FLEET ENEMA 7-19 GM/118ML RE ENEM: 1.0000 | ENEMA | Freq: Once | RECTAL | Status: AC | PRN

## 2014-02-15 MED ORDER — HYDROMORPHONE HCL 2 MG/ML IJ SOLN
INTRAMUSCULAR | Status: AC
  Filled 2014-02-15: qty 1

## 2014-02-15 MED ORDER — POLYETHYLENE GLYCOL 3350 17 G PO PACK: 17.0000 g | PACK | Freq: Every day | ORAL | Status: DC | PRN

## 2014-02-15 MED ORDER — ONDANSETRON HCL 4 MG/2ML IJ SOLN
INTRAMUSCULAR | Status: DC | PRN
  Administered 2014-02-15: 4 mg via INTRAVENOUS

## 2014-02-15 MED ORDER — HYDROMORPHONE HCL 1 MG/ML IJ SOLN
INTRAMUSCULAR | Status: AC
  Administered 2014-02-15: 0.5 mg via INTRAVENOUS
  Filled 2014-02-15: qty 1

## 2014-02-15 MED ORDER — ALPRAZOLAM 1 MG PO TABS: 1.0000 mg | ORAL_TABLET | Freq: Every day | ORAL | Status: DC

## 2014-02-15 MED ORDER — HYDROMORPHONE HCL 1 MG/ML IJ SOLN: 1.0000 mg | INTRAMUSCULAR | Status: DC | PRN

## 2014-02-15 MED ORDER — KETOROLAC TROMETHAMINE 30 MG/ML IJ SOLN
INTRAMUSCULAR | Status: AC
  Filled 2014-02-15: qty 1

## 2014-02-15 MED ORDER — ONDANSETRON HCL 4 MG/2ML IJ SOLN: 4.0000 mg | Freq: Four times a day (QID) | INTRAMUSCULAR | Status: DC | PRN

## 2014-02-15 MED ORDER — SODIUM CHLORIDE 0.9 % IJ SOLN
INTRAMUSCULAR | Status: DC | PRN
  Administered 2014-02-15: 20 mL

## 2014-02-15 MED ORDER — HYDROMORPHONE HCL 1 MG/ML IJ SOLN
INTRAMUSCULAR | Status: AC
  Filled 2014-02-15: qty 1

## 2014-02-15 MED ORDER — LACTATED RINGERS IV SOLN
INTRAVENOUS | Status: DC
  Administered 2014-02-15 (×3): via INTRAVENOUS

## 2014-02-15 MED ORDER — CEFAZOLIN SODIUM 1-5 GM-% IV SOLN
1.0000 g | Freq: Four times a day (QID) | INTRAVENOUS | Status: AC
  Administered 2014-02-15 (×2): 1 g via INTRAVENOUS
  Filled 2014-02-15 (×2): qty 50

## 2014-02-15 MED ORDER — AMLODIPINE BESYLATE 10 MG PO TABS: 10.0000 mg | ORAL_TABLET | Freq: Every day | ORAL | Status: DC

## 2014-02-15 MED ORDER — SODIUM CHLORIDE 0.9 % IR SOLN
Status: AC
  Filled 2014-02-15: qty 1

## 2014-02-15 SURGICAL SUPPLY — 69 items
BAG SPEC THK2 15X12 ZIP CLS (MISCELLANEOUS)
BAG ZIPLOCK 12X15 (MISCELLANEOUS) IMPLANT
BANDAGE ELASTIC 4 VELCRO ST LF (GAUZE/BANDAGES/DRESSINGS) ×3 IMPLANT
BANDAGE ELASTIC 6 VELCRO ST LF (GAUZE/BANDAGES/DRESSINGS) ×3 IMPLANT
BANDAGE ESMARK 6X9 LF (GAUZE/BANDAGES/DRESSINGS) ×1 IMPLANT
BLADE SAG 18X100X1.27 (BLADE) ×3 IMPLANT
BLADE SAW SGTL 11.0X1.19X90.0M (BLADE) ×3 IMPLANT
BNDG CMPR 9X6 STRL LF SNTH (GAUZE/BANDAGES/DRESSINGS) ×1
BNDG ESMARK 6X9 LF (GAUZE/BANDAGES/DRESSINGS) ×3
BONE CEMENT GENTAMICIN (Cement) ×6 IMPLANT
CAPT RP KNEE ×2 IMPLANT
CEMENT BONE GENTAMICIN 40 (Cement) ×2 IMPLANT
CUFF TOURN SGL QUICK 34 (TOURNIQUET CUFF) ×3
CUFF TRNQT CYL 34X4X40X1 (TOURNIQUET CUFF) ×1 IMPLANT
DRAPE EXTREMITY TIBURON (DRAPES) ×3 IMPLANT
DRAPE INCISE IOBAN 66X45 STRL (DRAPES) IMPLANT
DRAPE POUCH INSTRU U-SHP 10X18 (DRAPES) ×3 IMPLANT
DRAPE U-SHAPE 47X51 STRL (DRAPES) ×3 IMPLANT
DRSG AQUACEL AG ADV 3.5X10 (GAUZE/BANDAGES/DRESSINGS) ×3 IMPLANT
DRSG PAD ABDOMINAL 8X10 ST (GAUZE/BANDAGES/DRESSINGS) IMPLANT
DRSG TEGADERM 4X4.75 (GAUZE/BANDAGES/DRESSINGS) ×3 IMPLANT
DURAPREP 26ML APPLICATOR (WOUND CARE) ×3 IMPLANT
ELECT REM PT RETURN 9FT ADLT (ELECTROSURGICAL) ×3
ELECTRODE REM PT RTRN 9FT ADLT (ELECTROSURGICAL) ×1 IMPLANT
EVACUATOR 1/8 PVC DRAIN (DRAIN) ×3 IMPLANT
FACESHIELD WRAPAROUND (MASK) ×15 IMPLANT
FACESHIELD WRAPAROUND OR TEAM (MASK) ×5 IMPLANT
GAUZE SPONGE 2X2 8PLY STRL LF (GAUZE/BANDAGES/DRESSINGS) ×1 IMPLANT
GLOVE BIOGEL PI IND STRL 6.5 (GLOVE) ×1 IMPLANT
GLOVE BIOGEL PI IND STRL 8 (GLOVE) ×1 IMPLANT
GLOVE BIOGEL PI INDICATOR 6.5 (GLOVE) ×2
GLOVE BIOGEL PI INDICATOR 8 (GLOVE) ×2
GLOVE ECLIPSE 8.0 STRL XLNG CF (GLOVE) ×6 IMPLANT
GLOVE SURG SS PI 6.5 STRL IVOR (GLOVE) ×3 IMPLANT
GOWN STRL REUS W/TWL LRG LVL3 (GOWN DISPOSABLE) ×3 IMPLANT
GOWN STRL REUS W/TWL XL LVL3 (GOWN DISPOSABLE) ×3 IMPLANT
HANDPIECE INTERPULSE COAX TIP (DISPOSABLE) ×3
IMMOBILIZER KNEE 20 (SOFTGOODS) ×2 IMPLANT
IMMOBILIZER KNEE 20 THIGH 36 (SOFTGOODS) ×1 IMPLANT
KIT BASIN OR (CUSTOM PROCEDURE TRAY) ×3 IMPLANT
LABEL STERILE EO BLANK 1X3 WHT (LABEL) ×2 IMPLANT
LIQUID BAND (GAUZE/BANDAGES/DRESSINGS) ×3 IMPLANT
MANIFOLD NEPTUNE II (INSTRUMENTS) ×3 IMPLANT
NEEDLE HYPO 22GX1.5 SAFETY (NEEDLE) ×6 IMPLANT
NS IRRIG 1000ML POUR BTL (IV SOLUTION) IMPLANT
PACK TOTAL JOINT (CUSTOM PROCEDURE TRAY) ×3 IMPLANT
PADDING CAST COTTON 6X4 STRL (CAST SUPPLIES) IMPLANT
POSITIONER SURGICAL ARM (MISCELLANEOUS) ×3 IMPLANT
SET HNDPC FAN SPRY TIP SCT (DISPOSABLE) ×1 IMPLANT
SET PAD KNEE POSITIONER (MISCELLANEOUS) ×3 IMPLANT
SPONGE GAUZE 2X2 STER 10/PKG (GAUZE/BANDAGES/DRESSINGS) ×2
SPONGE LAP 18X18 X RAY DECT (DISPOSABLE) IMPLANT
SPONGE SURGIFOAM ABS GEL 100 (HEMOSTASIS) ×3 IMPLANT
STAPLER VISISTAT 35W (STAPLE) IMPLANT
SUCTION FRAZIER 12FR DISP (SUCTIONS) ×3 IMPLANT
SUT BONE WAX W31G (SUTURE) ×3 IMPLANT
SUT MNCRL AB 4-0 PS2 18 (SUTURE) ×3 IMPLANT
SUT VIC AB 1 CT1 27 (SUTURE) ×6
SUT VIC AB 1 CT1 27XBRD ANTBC (SUTURE) ×2 IMPLANT
SUT VIC AB 2-0 CT1 27 (SUTURE) ×9
SUT VIC AB 2-0 CT1 TAPERPNT 27 (SUTURE) ×3 IMPLANT
SUT VLOC 180 0 24IN GS25 (SUTURE) ×3 IMPLANT
SYR 20CC LL (SYRINGE) ×9 IMPLANT
TOWEL OR 17X26 10 PK STRL BLUE (TOWEL DISPOSABLE) ×3 IMPLANT
TOWEL OR NON WOVEN STRL DISP B (DISPOSABLE) IMPLANT
TOWER CARTRIDGE SMART MIX (DISPOSABLE) ×3 IMPLANT
TRAY FOLEY CATH 14FRSI W/METER (CATHETERS) ×3 IMPLANT
WATER STERILE IRR 1500ML POUR (IV SOLUTION) ×3 IMPLANT
WRAP KNEE MAXI GEL POST OP (GAUZE/BANDAGES/DRESSINGS) ×3 IMPLANT

## 2014-02-15 NOTE — Anesthesia Postprocedure Evaluation (Signed)
  Anesthesia Post-op Note  Patient: Peter Vang  Procedure(s) Performed: Procedure(s) (LRB): LEFT TOTAL KNEE ARTHROPLASTY (Left)  Patient Location: PACU  Anesthesia Type: General  Level of Consciousness: awake and alert   Airway and Oxygen Therapy: Patient Spontanous Breathing  Post-op Pain: mild  Post-op Assessment: Post-op Vital signs reviewed, Patient's Cardiovascular Status Stable, Respiratory Function Stable, Patent Airway and No signs of Nausea or vomiting  Last Vitals:  Filed Vitals:   02/15/14 1215  BP: 156/98  Pulse: 107  Temp: 36.6 C  Resp: 24    Post-op Vital Signs: stable   Complications: No apparent anesthesia complications

## 2014-02-15 NOTE — Brief Op Note (Signed)
02/15/2014  10:16 AM  PATIENT:  Peter Vang  48 y.o. male  PRE-OPERATIVE DIAGNOSIS:  OA LEFT KNEE  POST-OPERATIVE DIAGNOSIS:  OA LEFT KNEE with a Flexion Contracture  PROCEDURE:  Procedure(s): LEFT TOTAL KNEE ARTHROPLASTY (Left) and Release of Contractures.  SURGEON:  Surgeon(s) and Role:    * Tobi Bastos, MD - Primary  PHYSICIAN ASSISTANT:Amber Sunshine PA   ASSISTANTS:Amber [place]  ANESTHESIA:   general  EBL:  Total I/O In: 1000 [I.V.:1000] Out: 200 [Urine:200]  BLOOD ADMINISTERED:none  DRAINS: (one) Hemovact drain(s) in the Left Knee with  Suction Open   LOCAL MEDICATIONS USED:  MARCAINE 20cc of 0.25%,Plain , and later 20cc of Exparel with 20cc of Normal Saline.    SPECIMEN:  No Specimen  DISPOSITION OF SPECIMEN:  N/A  COUNTS:  YES  TOURNIQUET:  * Missing tourniquet times found for documented tourniquets in log:  [id number] *  DICTATION: .Other Dictation: Dictation Number [id number]  PLAN OF CARE: Admit to inpatient   PATIENT DISPOSITION:  Stable in OR   Delay start of Pharmacological VTE agent (>24hrs) due to surgical blood loss or risk of bleeding: yes

## 2014-02-15 NOTE — Op Note (Signed)
NAME:  Peter Vang, Peter Vang NO.:  [id number]  MEDICAL RECORD NO.:  [id number]  LOCATION:  [facility]                         FACILITY:  [facility]  PHYSICIAN:  Kipp Brood. Destenee Guerry, M.D.DATE OF BIRTH:  April 22, 1965  DATE OF PROCEDURE:  02/15/2014 DATE OF DISCHARGE:                              OPERATIVE REPORT   SURGEON:  Kipp Brood. Gladstone Lighter, M.D.  OPERATIVE ASSISTANT:  Ardeen Jourdain, PA  PREOPERATIVE DIAGNOSES: 1. Severe osteoarthritis with bone-on-bone, left knee. 2. Flexion contracture, left knee.  POSTOPERATIVE DIAGNOSES: 1. Severe osteoarthritis with bone-on-bone, left knee. 2. Flexion contracture, left knee.  OPERATION: 1. Release of contractures, left knee. 2. Left total knee arthroplasty utilizing the DePuy system.  The sizes     used were a size 5 left posterior cruciate sacrificing femoral     component, the tibial tray was a size 4, the insert was a size 5,     10 mm thickness rotating platform, the patella was a size 38 with 3     pegs.  All 3 components were cemented and gentamicin was used in     the cement.  DESCRIPTION OF PROCEDURE:  Under general anesthesia, routine orthopedic prepping and draping of the left lower extremity was carried out.  The appropriate time-out was first carried out.  I also marked the appropriate left leg in the holding area.  The left leg was exsanguinated in the usual fashion and the tourniquet was elevated after that at 325 mmHg.  At this particular point, a midline incision was made.  Two flaps were created and then followed by a medial parapatellar incision, I reflected patella laterally and the knee was held in the [facility] knee holder.  I then carried out medial and lateral meniscectomies and excised the anterior and posterior cruciate ligaments.  At this particular time, we made our initial drill hole in the intercondylar notch just above the notch area of the femur.  Canal finder was inserted.  We thoroughly irrigated out the  canal and then first removed 12 mm thickness of the distal femur that really was not enough.  We then removed 14 mm thickness.  At this time, we measured the femur to be a size 5.  We cut the femur for a size 5 femoral component. We carried out anterior, posterior, and chamfering cuts in the usual fashion.  After the femur was prepared, we went down and prepared the tibia.  We removed 6 mm thickness off the affected medial side of the tibia utilizing intramedullary guide.  At this time, we continued to do our releases and we entered, then inserted our spacer devices and had excellent fit and excellent stability.  We then removed those and then checked posteriorly.  First, we checked posteriorly, made sure we had no spurs __________ spurs were removed before we did the spacer blocks. After this, we then prepared the tibia by doing our keel cut in the tibia in the usual fashion.  We selected a size 4 tibial __________ tray.  We then did our notch cut out of the distal femur in the usual fashion.  Irrigated the knee out, and we inserted our trial components. We selected a 10 mm  thickness size 5 tray and it fit quite nicely.  We kept the knee in extension with the trial components in place and then did a resurfacing procedure on the patella in the usual fashion and then made three drill holes in the patella for a size 38 mm patella.  Trial components were removed.  We WaterPik'd the knee, dried the knee out, cemented all 3 components in simultaneously with gentamicin in the cement.  Once the cement was hardened, we then removed all loose pieces of cement.  Then, we WaterPik'd the knee to make sure all those loose pieces of cement were removed.  We then injected 20 mL of 0.25% Marcaine plain into the soft tissue.  We then inserted our permanent rotating platform of size 5, 10 mm thickness.  We reduced the knee and had excellent stability, excellent function __________ flexion and extension.   The knee was irrigated again.  Thrombin-soaked Gelfoam was inserted.  We then inserted a Hemovac drain and closed the knee in layers in the usual fashion.  Sterile dressings were applied.  The patient had 2 g of IV Ancef preop.          ______________________________ Kipp Brood. Gladstone Lighter, M.D.     RAG/MEDQ  D:  02/15/2014  T:  02/15/2014  Job:  [id number]

## 2014-02-15 NOTE — Interval H&P Note (Signed)
History and Physical Interval Note:  02/15/2014 8:00 AM  Peter Vang  has presented today for surgery, with the diagnosis of OA LEFT KNEE  The various methods of treatment have been discussed with the patient and family. After consideration of risks, benefits and other options for treatment, the patient has consented to  Procedure(s): LEFT TOTAL KNEE ARTHROPLASTY (Left) as a surgical intervention .  The patient's history has been reviewed, patient examined, no change in status, stable for surgery.  I have reviewed the patient's chart and labs.  Questions were answered to the patient's satisfaction.     Blakeley Margraf A

## 2014-02-15 NOTE — Anesthesia Preprocedure Evaluation (Addendum)
Anesthesia Evaluation  Patient identified by MRN, date of birth, ID band Patient awake    Reviewed: Allergy & Precautions, H&P , NPO status , Patient's Chart, lab work & pertinent test results  Airway Mallampati: II  TM Distance: >3 FB Neck ROM: full    Dental  (+) Caps, Dental Advisory Given Left upper front capped:   Pulmonary COPDCurrent Smoker,  breath sounds clear to auscultation  Pulmonary exam normal       Cardiovascular hypertension, Pt. on medications Rhythm:regular Rate:Normal     Neuro/Psych  Headaches, Anxiety Raynaud's negative neurological ROS  negative psych ROS   GI/Hepatic negative GI ROS, Neg liver ROS,   Endo/Other  negative endocrine ROS  Renal/GU negative Renal ROS  negative genitourinary   Musculoskeletal   Abdominal   Peds  Hematology negative hematology ROS (+)   Anesthesia Other Findings   Reproductive/Obstetrics negative OB ROS                            Anesthesia Physical Anesthesia Plan  ASA: III  Anesthesia Plan: General   Post-op Pain Management:    Induction: Intravenous  Airway Management Planned: Oral ETT  Additional Equipment:   Intra-op Plan:   Post-operative Plan: Extubation in OR  Informed Consent: I have reviewed the patients History and Physical, chart, labs and discussed the procedure including the risks, benefits and alternatives for the proposed anesthesia with the patient or authorized representative who has indicated his/her understanding and acceptance.   Dental Advisory Given  Plan Discussed with: CRNA and Surgeon  Anesthesia Plan Comments:         Anesthesia Quick Evaluation

## 2014-02-15 NOTE — Transfer of Care (Signed)
Immediate Anesthesia Transfer of Care Note  Patient: Peter Vang  Procedure(s) Performed: Procedure(s): LEFT TOTAL KNEE ARTHROPLASTY (Left)  Patient Location: PACU  Anesthesia Type:General  Level of Consciousness: awake, alert  and oriented  Airway & Oxygen Therapy: Patient Spontanous Breathing and Patient connected to face mask oxygen  Post-op Assessment: Report given to PACU RN and Post -op Vital signs reviewed and stable  Post vital signs: Reviewed and stable  Complications: No apparent anesthesia complications

## 2014-02-15 NOTE — Evaluation (Signed)
Physical Therapy Evaluation Patient Details Name: Peter Vang MRN: [id number] DOB: 1965/11/21 Today's Date: 02/15/2014   History of Present Illness  L TKR  Clinical Impression  Pt s/p  L TKR presents with decreased L LE strength/ROM and post op pain limiting functional mobility.  Pt should progress to d/c home with family assist and HHPT follow up.    Follow Up Recommendations Home health PT    Equipment Recommendations  Rolling walker with 5" wheels    Recommendations for Other Services OT consult     Precautions / Restrictions Precautions Precautions: Fall;Knee Required Braces or Orthoses: Knee Immobilizer - Left Knee Immobilizer - Left: Discontinue once straight leg raise with < 10 degree lag Restrictions Weight Bearing Restrictions: No Other Position/Activity Restrictions: WBAT      Mobility  Bed Mobility Overal bed mobility: Needs Assistance Bed Mobility: Supine to Sit;Sit to Supine     Supine to sit: Min assist Sit to supine: Min assist   General bed mobility comments: cues for sequence and use of R LE to self assist  Transfers Overall transfer level: Needs assistance Equipment used: Rolling walker (2 wheeled) Transfers: Sit to/from Stand Sit to Stand: Min assist         General transfer comment: cues for LE management and use of UEs to self assist  Ambulation/Gait Ambulation/Gait assistance: Min assist Ambulation Distance (Feet): 75 Feet Assistive device: Rolling walker (2 wheeled) Gait Pattern/deviations: Step-to pattern;Decreased step length - right;Decreased step length - left;Shuffle     General Gait Details: cues for sequence, posture and position from [employer]            Wheelchair Mobility    Modified Rankin (Stroke Patients Only)       Balance                                             Pertinent Vitals/Pain Pain Assessment: 0-10 Pain Score: 6  Pain Location: L knee and buttocks Pain  Descriptors / Indicators: Aching Pain Intervention(s): Limited activity within patient's tolerance;Monitored during session;Premedicated before session;Ice applied    Home Living Family/patient expects to be discharged to:: Private residence Living Arrangements: Spouse/significant other Available Help at Discharge: Family Type of Home: House Home Access: Stairs to enter Entrance Stairs-Rails: None Technical brewer of Steps: 3 Home Layout: One level Home Equipment: Crutches      Prior Function Level of Independence: Independent               Hand Dominance        Extremity/Trunk Assessment   Upper Extremity Assessment: Overall WFL for tasks assessed           Lower Extremity Assessment: LLE deficits/detail      Cervical / Trunk Assessment: Normal  Communication   Communication: No difficulties  Cognition Arousal/Alertness: Awake/alert Behavior During Therapy: WFL for tasks assessed/performed Overall Cognitive Status: Within Functional Limits for tasks assessed                      General Comments      Exercises        Assessment/Plan    PT Assessment Patient needs continued PT services  PT Diagnosis Difficulty walking   PT Problem List Decreased strength;Decreased range of motion;Decreased activity tolerance;Decreased mobility;Decreased knowledge of use of DME;Pain  PT Treatment Interventions DME instruction;Gait training;Stair training;Functional  mobility training;Therapeutic activities;Therapeutic exercise;Patient/family education   PT Goals (Current goals can be found in the Care Plan section) Acute Rehab PT Goals Patient Stated Goal: Resume previous lifestyle with decreased pain PT Goal Formulation: With patient Time For Goal Achievement: 02/22/14 Potential to Achieve Goals: Good    Frequency 7X/week   Barriers to discharge        Co-evaluation               End of Session Equipment Utilized During Treatment: Gait  belt;Left knee immobilizer Activity Tolerance: Patient tolerated treatment well Patient left: in bed;with call bell/phone within reach;with family/visitor present Nurse Communication: Mobility status         Time: 9021-1155 PT Time Calculation (min): 24 min   Charges:   PT Evaluation $Initial PT Evaluation Tier I: 1 Procedure PT Treatments $Gait Training: 8-22 mins   PT G Codes:          Peter Vang 02/15/2014, 5:37 PM

## 2014-02-15 NOTE — Progress Notes (Signed)
Dr ewell aware of uncintrolled pain, minimal relief after3.5 Dilaudid and robaxin.  Hr 105-. Authorized transfer to floor.

## 2014-02-16 ENCOUNTER — Encounter (HOSPITAL_COMMUNITY): Payer: Self-pay | Admitting: Orthopedic Surgery

## 2014-02-16 LAB — CBC
HEMATOCRIT: 34.6 % — AB (ref 39.0–52.0)
Hemoglobin: 11.7 g/dL — ABNORMAL LOW (ref 13.0–17.0)
MCH: 32.1 pg (ref 26.0–34.0)
MCHC: 33.8 g/dL (ref 30.0–36.0)
MCV: 95.1 fL (ref 78.0–100.0)
Platelets: 259 10*3/uL (ref 150–400)
RBC: 3.64 MIL/uL — ABNORMAL LOW (ref 4.22–5.81)
RDW: 12.4 % (ref 11.5–15.5)
WBC: 12.6 10*3/uL — ABNORMAL HIGH (ref 4.0–10.5)

## 2014-02-16 LAB — BASIC METABOLIC PANEL
ANION GAP: 11 (ref 5–15)
BUN: 16 mg/dL (ref 6–23)
CALCIUM: 9.2 mg/dL (ref 8.4–10.5)
CO2: 27 meq/L (ref 19–32)
CREATININE: 1.09 mg/dL (ref 0.50–1.35)
Chloride: 102 mEq/L (ref 96–112)
GFR calc Af Amer: >90 mL/min (ref >90)
GFR calc non Af Amer: 78 mL/min — ABNORMAL LOW (ref >90)
Glucose, Bld: 117 mg/dL — ABNORMAL HIGH (ref 70–99)
Potassium: 4.5 mEq/L (ref 3.7–5.3)
Sodium: 140 mEq/L (ref 137–147)

## 2014-02-16 MED ORDER — LORAZEPAM 1 MG PO TABS
1.0000 mg | ORAL_TABLET | Freq: Three times a day (TID) | ORAL | Status: DC | PRN
  Administered 2014-02-16 – 2014-02-17 (×3): 1 mg via ORAL
  Filled 2014-02-16 (×3): qty 1

## 2014-02-16 MED ORDER — DOCUSATE SODIUM 100 MG PO CAPS
100.0000 mg | ORAL_CAPSULE | Freq: Two times a day (BID) | ORAL | Status: DC
  Administered 2014-02-16 – 2014-02-17 (×3): 100 mg via ORAL

## 2014-02-16 MED ORDER — POLYETHYLENE GLYCOL 3350 17 G PO PACK: 17.0000 g | PACK | Freq: Every day | ORAL | Status: DC | PRN

## 2014-02-16 MED ORDER — OXYCODONE HCL 5 MG PO TABS: 5.0000 mg | ORAL_TABLET | ORAL | Status: DC | PRN

## 2014-02-16 MED ORDER — RIVAROXABAN 10 MG PO TABS: 10.0000 mg | ORAL_TABLET | Freq: Every day | ORAL | Status: DC

## 2014-02-16 MED ORDER — METHOCARBAMOL 500 MG PO TABS: 500.0000 mg | ORAL_TABLET | Freq: Four times a day (QID) | ORAL | Status: DC | PRN

## 2014-02-16 MED ORDER — DSS 100 MG PO CAPS: 100.0000 mg | ORAL_CAPSULE | Freq: Two times a day (BID) | ORAL | Status: DC

## 2014-02-16 NOTE — Progress Notes (Signed)
PT NOTE:  PT attempted x 2 - deferred x 2 2* pt pain level.  RN aware.  Will follow in pm.

## 2014-02-16 NOTE — Plan of Care (Signed)
Problem: Phase II Progression Outcomes Goal: Tolerating diet Outcome: Completed/Met Date Met:  02/16/14  Problem: Discharge Progression Outcomes Goal: CMS/Neurovascular status at or above baseline Outcome: Progressing

## 2014-02-16 NOTE — Discharge Instructions (Addendum)
Walk with your walker. Weight bearing as tolerated [facility] will follow you at home for your therapy Change dressing daily. Shower only, no tub bath. Call if any temperatures greater than 101 or any wound complications: [phone] during the day and ask for Dr. Charlestine Night nurse, Brunilda Payor.  ===============================================  Information on my medicine - XARELTO (Rivaroxaban)  This medication education was reviewed with me or my healthcare representative as part of my discharge preparation.  The pharmacist that spoke with me during my hospital stay was:  Absher, Julieta Bellini, RPH  Why was Xarelto prescribed for you? Xarelto was prescribed for you to reduce the risk of blood clots forming after orthopedic surgery. The medical term for these abnormal blood clots is venous thromboembolism (VTE).  What do you need to know about xarelto ? Take your Xarelto ONCE DAILY at the same time every day. You may take it either with or without food.  If you have difficulty swallowing the tablet whole, you may crush it and mix in applesauce just prior to taking your dose.  Take Xarelto exactly as prescribed by your doctor and DO NOT stop taking Xarelto without talking to the doctor who prescribed the medication.  Stopping without other VTE prevention medication to take the place of Xarelto may increase your risk of developing a clot.  After discharge, you should have regular check-up appointments with your healthcare provider that is prescribing your Xarelto.    What do you do if you miss a dose? If you miss a dose, take it as soon as you remember on the same day then continue your regularly scheduled once daily regimen the next day. Do not take two doses of Xarelto on the same day.   Important Safety Information A possible side effect of Xarelto is bleeding. You should call your healthcare provider right away if you experience any of the following: ? Bleeding from an  injury or your nose that does not stop. ? Unusual colored urine (red or dark brown) or unusual colored stools (red or black). ? Unusual bruising for unknown reasons. ? A serious fall or if you hit your head (even if there is no bleeding).  Some medicines may interact with Xarelto and might increase your risk of bleeding while on Xarelto. To help avoid this, consult your healthcare provider or pharmacist prior to using any new prescription or non-prescription medications, including herbals, vitamins, non-steroidal anti-inflammatory drugs (NSAIDs) and supplements.  This website has more information on Xarelto: [email or web address].[email or web address]

## 2014-02-16 NOTE — Progress Notes (Signed)
Physical Therapy Treatment Patient Details Name: Peter Vang MRN: [id number] DOB: 1965-10-23 Today's Date: 02/16/2014    History of Present Illness L TKR    PT Comments    Pt progressing but pain limited in all activities.  Follow Up Recommendations  Home health PT     Equipment Recommendations  Rolling walker with 5" wheels    Recommendations for Other Services OT consult     Precautions / Restrictions Precautions Precautions: Fall;Knee Required Braces or Orthoses: Knee Immobilizer - Left Knee Immobilizer - Left: Discontinue once straight leg raise with < 10 degree lag Restrictions Weight Bearing Restrictions: No Other Position/Activity Restrictions: WBAT    Mobility  Bed Mobility Overal bed mobility: Needs Assistance Bed Mobility: Supine to Sit;Sit to Supine     Supine to sit: Min guard Sit to supine: Min assist   General bed mobility comments: assist for R LE off the bed.  Transfers Overall transfer level: Needs assistance Equipment used: Rolling walker (2 wheeled) Transfers: Sit to/from Stand Sit to Stand: Min guard         General transfer comment: vebal cues hand placement and LE management.  Ambulation/Gait Ambulation/Gait assistance: Min guard Ambulation Distance (Feet): 123 Feet Assistive device: Rolling walker (2 wheeled) Gait Pattern/deviations: Step-to pattern;Decreased step length - right;Decreased step length - left;Shuffle;Trunk flexed Gait velocity: decr   General Gait Details: Pt found up ambulating on own sans knee brace and min WB on toes only.  cues for sequence, posture, increased heel contact and position from RW.  Reviewed need for KI for ambulation   Stairs            Wheelchair Mobility    Modified Rankin (Stroke Patients Only)       Balance                                    Cognition Arousal/Alertness: Awake/alert Behavior During Therapy: WFL for tasks assessed/performed Overall  Cognitive Status: Within Functional Limits for tasks assessed                      Exercises Total Joint Exercises Ankle Circles/Pumps: AROM;Both;15 reps;Supine Quad Sets: AROM;Both;10 reps;Supine Heel Slides: AAROM;Left;10 reps;Supine Long Arc Quad: AAROM;Left;10 reps;Supine    General Comments        Pertinent Vitals/Pain Pain Assessment: 0-10 Pain Score: 8  Pain Location: L knee Pain Descriptors / Indicators: Constant;Grimacing Pain Intervention(s): Limited activity within patient's tolerance;Monitored during session;Premedicated before session;Patient requesting pain meds-RN notified;RN gave pain meds during session;Ice applied    Home Living                      Prior Function            PT Goals (current goals can now be found in the care plan section) Acute Rehab PT Goals Patient Stated Goal: decrease pain PT Goal Formulation: With patient Time For Goal Achievement: 02/22/14 Potential to Achieve Goals: Good Progress towards PT goals: Progressing toward goals    Frequency  7X/week    PT Plan Current plan remains appropriate    Co-evaluation             End of Session   Activity Tolerance: Patient limited by pain Patient left: in bed;with call bell/phone within reach     Time: 1410-1440 PT Time Calculation (min): 30 min  Charges:  $Gait Training: 8-22 mins $  Therapeutic Exercise: 8-22 mins                    G Codes:      Hideko Esselman 03-05-14, 4:38 PM

## 2014-02-16 NOTE — Progress Notes (Signed)
Subjective: 1 Day Post-Op Procedure(s) (LRB): LEFT TOTAL KNEE ARTHROPLASTY (Left) Patient reports pain as 5 on 0-10 scale. Hemovac DCd and he is much better this morning.   Objective: Vital signs in last 24 hours: Temp:  [97.2 F (36.2 C)-99.8 F (37.7 C)] 98.4 F (36.9 C) (11/05 0441) Pulse Rate:  [97-133] 98 (11/05 0441) Resp:  [16-25] 18 (11/05 0441) BP: (129-159)/(65-133) 145/65 mmHg (11/05 0441) SpO2:  [91 %-100 %] 94 % (11/05 0441) Weight:  [87.998 kg (194 lb)] 87.998 kg (194 lb) (11/04 1806)  Intake/Output from previous day: 11/04 0701 - 11/05 0700 In: 6173.3 [P.O.:1920; I.V.:4203.3; IV Piggyback:50] Out: 3567 [Urine:4850; Drains:16] Intake/Output this shift:     Recent Labs  02/16/14 0505  HGB 11.7*    Recent Labs  02/16/14 0505  WBC 12.6*  RBC 3.64*  HCT 34.6*  PLT 259    Recent Labs  02/16/14 0505  NA 140  K 4.5  CL 102  CO2 27  BUN 16  CREATININE 1.09  GLUCOSE 117*  CALCIUM 9.2   No results for input(s): LABPT, INR in the last 72 hours.  Dorsiflexion/Plantar flexion intact No cellulitis present Compartment soft  Assessment/Plan: 1 Day Post-Op Procedure(s) (LRB): LEFT TOTAL KNEE ARTHROPLASTY (Left) Up with therapy  Taviana Westergren A 02/16/2014, 7:16 AM

## 2014-02-16 NOTE — Progress Notes (Signed)
UR complete. Peter Vang, BSN, CM [phone]. 

## 2014-02-16 NOTE — Evaluation (Signed)
Occupational Therapy Evaluation Patient Details Name: Peter Vang MRN: [id number] DOB: 1966/02/02 Today's Date: 02/16/2014    History of Present Illness L TKR   Clinical Impression   Pt limited by pain with activity up to 8/10 after toilet transfer. HR up to 139 after toilet transfer and down to 122-125 with several minutes of rest. Informed nursing. Will follow for acute OT to progress ADL independence for d/c home with wife.    Follow Up Recommendations  No OT follow up;Supervision/Assistance - 24 hour    Equipment Recommendations  3 in 1 bedside comode    Recommendations for Other Services       Precautions / Restrictions Precautions Precautions: Fall;Knee Precaution Comments: monitor HR Required Braces or Orthoses: Knee Immobilizer - Left Knee Immobilizer - Left: Discontinue once straight leg raise with < 10 degree lag Restrictions Weight Bearing Restrictions: No Other Position/Activity Restrictions: WBAT      Mobility Bed Mobility Overal bed mobility: Needs Assistance Bed Mobility: Supine to Sit     Supine to sit: Min assist     General bed mobility comments: assist for R LE off the bed.  Transfers Overall transfer level: Needs assistance Equipment used: Rolling walker (2 wheeled) Transfers: Sit to/from Stand Sit to Stand: Min assist         General transfer comment: vebal cues hand placement and LE management.    Balance                                            ADL   Eating/Feeding: Independent;Sitting   Grooming: Wash/dry hands;Set up;Sitting   Upper Body Bathing: Set up;Sitting   Lower Body Bathing: Moderate assistance;Sit to/from stand   Upper Body Dressing : Set up;Sitting   Lower Body Dressing: Moderate assistance;Sit to/from stand   Toilet Transfer: Minimal assistance;Ambulation;BSC;RW   Toileting- Clothing Manipulation and Hygiene: Minimal assistance;Sit to/from stand         General ADL Comments:  Discussed options of DME for tub and pt states he will just sponge bathe initially. Explained purpose of KI and that he needs to wear when he gets up until he is able to SLR. Pt states wife will help with LB dressing and he is not interested in AE at this time. Practiced 3in1 transfer with min cues for safety with walker and hand placement on 3in1. Pt limited by pain and HR did elevate to 139 after toilet transfer. Down to 122-125 with rest.     Vision                     Perception     Praxis      Pertinent Vitals/Pain Pain Assessment: 0-10 Pain Score: 8  Pain Location: L knee Pain Descriptors / Indicators: Aching Pain Intervention(s): Repositioned;Ice applied     Hand Dominance     Extremity/Trunk Assessment Upper Extremity Assessment Upper Extremity Assessment: Overall WFL for tasks assessed           Communication Communication Communication: No difficulties   Cognition Arousal/Alertness: Awake/alert Behavior During Therapy: WFL for tasks assessed/performed Overall Cognitive Status: Within Functional Limits for tasks assessed                     General Comments       Exercises       Shoulder Instructions  Home Living Family/patient expects to be discharged to:: Private residence Living Arrangements: Spouse/significant other Available Help at Discharge: Family Type of Home: House Home Access: Stairs to enter Technical brewer of Steps: 3 Entrance Stairs-Rails: None Home Layout: One level     Bathroom Shower/Tub: Teacher, early years/pre: Standard     Home Equipment: Crutches          Prior Functioning/Environment Level of Independence: Independent             OT Diagnosis: Generalized weakness   OT Problem List: Decreased strength;Decreased knowledge of use of DME or AE   OT Treatment/Interventions: Self-care/ADL training;Patient/family education;Therapeutic activities;DME and/or AE instruction    OT  Goals(Current goals can be found in the care plan section) Acute Rehab OT Goals Patient Stated Goal: decrease pain OT Goal Formulation: With patient Time For Goal Achievement: 02/23/14 Potential to Achieve Goals: Good  OT Frequency: Min 2X/week   Barriers to D/C:            Co-evaluation              End of Session Equipment Utilized During Treatment: Gait belt;Rolling walker;Left knee immobilizer  Activity Tolerance: Patient limited by pain Patient left: in chair;with call bell/phone within reach   Time: 1115-1149 OT Time Calculation (min): 34 min Charges:  OT General Charges $OT Visit: 1 Procedure OT Evaluation $Initial OT Evaluation Tier I: 1 Procedure OT Treatments $Self Care/Home Management : 8-22 mins $Therapeutic Activity: 8-22 mins G-Codes:    Jules Schick  [phone] 02/16/2014, 12:07 PM

## 2014-02-16 NOTE — Care Management Note (Signed)
    Page 1 of 2   02/16/2014     2:05:23 PM CARE MANAGEMENT NOTE 02/16/2014  Patient:  Peter Vang, Peter Vang   Account Number:  [id number]  Date Initiated:  02/16/2014  Documentation initiated by:  [facility]  Subjective/Objective Assessment:   adm: LEFT TOTAL KNEE ARTHROPLASTY (Left)     Action/Plan:   discharge planning   Anticipated DC Date:  02/17/2014   Anticipated DC Plan:  Peter Vang  CM consult      [facility] Choice  HOME HEALTH   Choice offered to / List presented to:  [name]   DME arranged  3-N-1  Peter Vang      DME agency  [place] arranged  [place]   Status of service:  Completed, signed off Medicare Important Message given?   (If response is "NO", the following Medicare IM given date fields will be blank) Date Medicare IM given:   Medicare IM given by:   Date Additional Medicare IM given:   Additional Medicare IM given by:    Discharge Disposition:  [place]  Per UR Regulation:    If discussed at Long Length of Stay Meetings, dates discussed:    Comments:  02/16/14 08:15 CM met with pt in room to offer choice of home health agency.  Pt chooses Peter Vang to render HHPT.  Cm called [facility] DME rep, Peter Vang to please deliver 3n1 and rolling walker to room prior to discharge.  Address and contact information verified with pt and pt requests contact number be [phone].  Referral called to Peter Vang with new contact number.  No other CM needs were communicated.  Peter Vang, BSN, CM [phone].

## 2014-02-17 LAB — BASIC METABOLIC PANEL
Anion gap: 14 (ref 5–15)
BUN: 21 mg/dL (ref 6–23)
CALCIUM: 9.3 mg/dL (ref 8.4–10.5)
CO2: 27 mEq/L (ref 19–32)
Chloride: 100 mEq/L (ref 96–112)
Creatinine, Ser: 1.17 mg/dL (ref 0.50–1.35)
GFR calc Af Amer: 83 mL/min — ABNORMAL LOW (ref >90)
GFR, EST NON AFRICAN AMERICAN: 72 mL/min — AB (ref >90)
GLUCOSE: 107 mg/dL — AB (ref 70–99)
POTASSIUM: 4.4 meq/L (ref 3.7–5.3)
Sodium: 141 mEq/L (ref 137–147)

## 2014-02-17 LAB — CBC
HEMATOCRIT: 34 % — AB (ref 39.0–52.0)
Hemoglobin: 11.4 g/dL — ABNORMAL LOW (ref 13.0–17.0)
MCH: 32.2 pg (ref 26.0–34.0)
MCHC: 33.5 g/dL (ref 30.0–36.0)
MCV: 96 fL (ref 78.0–100.0)
Platelets: 249 10*3/uL (ref 150–400)
RBC: 3.54 MIL/uL — ABNORMAL LOW (ref 4.22–5.81)
RDW: 12.4 % (ref 11.5–15.5)
WBC: 9.6 10*3/uL (ref 4.0–10.5)

## 2014-02-17 NOTE — Plan of Care (Signed)
Problem: Discharge Progression Outcomes Goal: Barriers To Progression Addressed/Resolved Outcome: Completed/Met Date Met:  02/17/14 Goal: CMS/Neurovascular status at or above baseline Outcome: Completed/Met Date Met:  02/17/14 Goal: Anticoagulant follow-up in place Outcome: Completed/Met Date Met:  02/17/14 Goal: Pain controlled with appropriate interventions Outcome: Completed/Met Date Met:  02/17/14 Goal: Hemodynamically stable Outcome: Completed/Met Date Met:  28/83/37 Goal: Complications resolved/controlled Outcome: Completed/Met Date Met:  02/17/14 Goal: Tolerates diet Outcome: Completed/Met Date Met:  02/17/14 Goal: Activity appropriate for discharge plan Outcome: Completed/Met Date Met:  02/17/14 Goal: Ambulates safely using assistive device Outcome: Completed/Met Date Met:  02/17/14 Goal: Follows weight - bearing limitations Outcome: Completed/Met Date Met:  02/17/14

## 2014-02-17 NOTE — Progress Notes (Signed)
Occupational Therapy Treatment Patient Details Name: Peter Vang MRN: [id number] DOB: [birth year] Today's Date: 02/17/2014    History of present illness L TKR   OT comments  Pt still limited by pain (10/10 with activity) but tolerated up to 3in1 for practice and education. Discussed safety with dressing and toileting tasks with pt and wife. Pt needs min cues for safety with functional transfers including hand placement and backing up to surface fully before sitting. Wife able to assist at d/c. Reviewed need for KI when up on feet and use of ice frequently.    Follow Up Recommendations  No OT follow up;Supervision/Assistance - 24 hour    Equipment Recommendations  3 in 1 bedside comode (in room)    Recommendations for Other Services      Precautions / Restrictions Precautions Precautions: Fall;Knee Precaution Comments: monitor HR Required Braces or Orthoses: Knee Immobilizer - Left Knee Immobilizer - Left: Discontinue once straight leg raise with < 10 degree lag Restrictions Weight Bearing Restrictions: No Other Position/Activity Restrictions: WBAT       Mobility Bed Mobility         Supine to sit: Min assist Sit to supine: Min assist      Transfers Overall transfer level: Needs assistance Equipment used: Rolling walker (2 wheeled) Transfers: Sit to/from Stand Sit to Stand: Min guard         General transfer comment: vebal cues hand placement and LE management.    Balance                                   ADL                           Toilet Transfer: Min guard;Ambulation;BSC;RW   Toileting- Water quality scientist and Hygiene: Min guard;Sit to/from stand         General ADL Comments: Wife present for session. Pt and wife aware that tub transfer would not be safe right now as pt with a lot of pain and not able to raise LE on his own. Discussed tub DME options and coverage if they decide to get one but pt will start out  sponge bathing and see how it goes at home. Advised that [facility] can also practice tub transfer once pt more ready. Pt attempting to sit on 3in1 before fully backed up to 3in1 so cautioned pt and wife several times to make sure he is fully backed up to 3in1 before attempting to sit. Discussed hand placement with transfers and overall safety. Also reviewed sequence for LB dressing and importance of KI when up. Discussed strategies for LB dressing and how to don KI over pants. Discussed use of ice frequently and how to prop L LE on stool on 3in1 if needed and unable to tolerate LE bending to floor.       Vision                     Perception     Praxis      Cognition   Behavior During Therapy: [facility] for tasks assessed/performed Overall Cognitive Status: Within Functional Limits for tasks assessed                       Extremity/Trunk Assessment               Exercises  Shoulder Instructions       General Comments      Pertinent Vitals/ Pain       Pain Assessment: 0-10 Pain Score: 10-Worst pain ever Pain Location: L knee; 10 with activity. down to an 8 with rest. Pain Descriptors / Indicators: Aching Pain Intervention(s): Patient requesting pain meds-RN notified;Repositioned;Ice applied  Home Living                                          Prior Functioning/Environment              Frequency Min 2X/week     Progress Toward Goals  OT Goals(current goals can now be found in the care plan section)  Progress towards OT goals: Progressing toward goals     Plan Discharge plan remains appropriate    Co-evaluation                 End of Session Equipment Utilized During Treatment: Rolling walker;Left knee immobilizer   Activity Tolerance Patient limited by pain   Patient Left in bed;with call bell/phone within reach;with family/visitor present   Nurse Communication          Time: 1093-2355 OT Time Calculation (min):  15 min  Charges: OT General Charges $OT Visit: 1 Procedure OT Treatments $Therapeutic Activity: 8-22 mins  Jules Schick [phone] 02/17/2014, 9:44 AM

## 2014-02-17 NOTE — Progress Notes (Signed)
   Subjective: 2 Days Post-Op Procedure(s) (LRB): LEFT TOTAL KNEE ARTHROPLASTY (Left) Patient reports pain as moderate.   Patient seen in rounds without Dr. Gladstone Lighter. Patient is well, and has had no acute complaints or problems other than pain in the knee. Voiding well. Positive flatus. No issues overnight. Has been up and walking with therapy.  Plan is to go Home after hospital stay.  Objective: Vital signs in last 24 hours: Temp:  [98.3 F (36.8 C)-99 F (37.2 C)] 98.9 F (37.2 C) (11/06 0603) Pulse Rate:  [111-139] 114 (11/06 0603) Resp:  [16-20] 20 (11/06 0603) BP: (121-145)/(73-98) 129/73 mmHg (11/06 0603) SpO2:  [92 %-95 %] 93 % (11/06 0603)  Intake/Output from previous day:  Intake/Output Summary (Last 24 hours) at 02/17/14 0751 Last data filed at 02/17/14 0400  Gross per 24 hour  Intake    720 ml  Output   2150 ml  Net  -1430 ml     Labs:  Recent Labs  02/16/14 0505 02/17/14 0500  HGB 11.7* 11.4*    Recent Labs  02/16/14 0505 02/17/14 0500  WBC 12.6* 9.6  RBC 3.64* 3.54*  HCT 34.6* 34.0*  PLT 259 249    Recent Labs  02/16/14 0505 02/17/14 0500  NA 140 141  K 4.5 4.4  CL 102 100  CO2 27 27  BUN 16 21  CREATININE 1.09 1.17  GLUCOSE 117* 107*  CALCIUM 9.2 9.3    EXAM General - Patient is Alert and Oriented Extremity - Neurologically intact Intact pulses distally Dorsiflexion/Plantar flexion intact Compartment soft Dressing/Incision - clean, dry, no drainage Motor Function - intact, moving foot and toes well on exam.   Past Medical History  Diagnosis Date  . COPD (chronic obstructive pulmonary disease)   . Hx of colonic polyps   . Restless leg syndrome   . Cluster headache   . Hypertension   . Ptosis     OS  . Sleep disorder   . Arthritis     DJD  . HLD (hyperlipidemia)   . Lipoma   . Anxiety   . Emphysema of lung   . GERD (gastroesophageal reflux disease)     Assessment/Plan: 2 Days Post-Op Procedure(s) (LRB): LEFT TOTAL  KNEE ARTHROPLASTY (Left) Active Problems:   Total knee replacement status  Estimated body mass index is 27.84 kg/(m^2) as calculated from the following:   Height as of this encounter: 5\' 10"  (1.778 m).   Weight as of this encounter: 87.998 kg (194 lb). Advance diet Up with therapy D/C IV fluids Discharge home with home health  DVT Prophylaxis - Xarelto Weight-Bearing as tolerated   Discharge home today. Doing well this therapy. Will follow in office in 2 weeks.   Ardeen Jourdain, PA-C Orthopaedic Surgery 02/17/2014, 7:51 AM

## 2014-02-17 NOTE — Progress Notes (Signed)
Physical Therapy Treatment Patient Details Name: Peter Vang MRN: [id number] DOB: 03-08-1966 Today's Date: 02/17/2014    History of Present Illness L TKR    PT Comments    Pt eager for d/c home but continues ltd by pain.  Pt observed to have pillow under knee on arrival to room.  Reviewed again with pt and wife need to maintain knee ext while resting.  Also reviewed don/doff KI and stairs.  Follow Up Recommendations  Home health PT     Equipment Recommendations  Rolling walker with 5" wheels    Recommendations for Other Services OT consult     Precautions / Restrictions Precautions Precautions: Fall;Knee Precaution Comments: monitor HR Required Braces or Orthoses: Knee Immobilizer - Left Knee Immobilizer - Left: Discontinue once straight leg raise with < 10 degree lag Restrictions Weight Bearing Restrictions: No Other Position/Activity Restrictions: WBAT    Mobility  Bed Mobility Overal bed mobility: Needs Assistance Bed Mobility: Supine to Sit;Sit to Supine     Supine to sit: Min assist Sit to supine: Min assist   General bed mobility comments: Pt self assisting R LE with L LE  Transfers Overall transfer level: Needs assistance Equipment used: Rolling walker (2 wheeled) Transfers: Sit to/from Stand Sit to Stand: Min guard         General transfer comment: vebal cues hand placement and LE management.  Ambulation/Gait Ambulation/Gait assistance: Min guard;Supervision Ambulation Distance (Feet): 100 Feet Assistive device: Rolling walker (2 wheeled) Gait Pattern/deviations: Step-to pattern;Decreased step length - right;Decreased step length - left;Shuffle;Trunk flexed;Antalgic     General Gait Details: cues for sequence and position from RW - noted improvement in heel contact on L   Stairs Stairs: Yes Stairs assistance: Min assist Stair Management: One rail Right;Step to pattern;Forwards;With crutches Number of Stairs: 3 General stair comments:  cues for sequence and foot/crutch placement  Wheelchair Mobility    Modified Rankin (Stroke Patients Only)       Balance                                    Cognition Arousal/Alertness: Awake/alert Behavior During Therapy: WFL for tasks assessed/performed Overall Cognitive Status: Within Functional Limits for tasks assessed                      Exercises Total Joint Exercises Ankle Circles/Pumps: AROM;Both;15 reps;Supine Quad Sets: AROM;Both;Supine;15 reps Heel Slides: AAROM;Left;Supine;15 reps Straight Leg Raises: AAROM;15 reps;Supine;Left Goniometric ROM: AAROM at L knee -15 - 60    General Comments        Pertinent Vitals/Pain Pain Assessment: 0-10 Pain Score: 10-Worst pain ever Pain Location: L knee; 10 with activity. down to an 8 with rest. Pain Descriptors / Indicators: Aching Pain Intervention(s): Patient requesting pain meds-RN notified;Repositioned;Ice applied    Home Living                      Prior Function            PT Goals (current goals can now be found in the care plan section) Acute Rehab PT Goals Patient Stated Goal: decrease pain PT Goal Formulation: With patient Time For Goal Achievement: 02/22/14 Potential to Achieve Goals: Good Progress towards PT goals: Progressing toward goals    Frequency  7X/week    PT Plan Current plan remains appropriate    Co-evaluation  End of Session Equipment Utilized During Treatment: Gait belt;Left knee immobilizer Activity Tolerance: Patient limited by pain Patient left: in bed;with call bell/phone within reach;with family/visitor present     Time: 0832-0905 PT Time Calculation (min): 33 min  Charges:  $Gait Training: 8-22 mins $Therapeutic Exercise: 8-22 mins                    G Codes:      Francesa Eugenio 03/12/14, 12:27 PM

## 2014-02-21 ENCOUNTER — Other Ambulatory Visit (HOSPITAL_COMMUNITY): Payer: Self-pay | Admitting: Orthopedic Surgery

## 2014-02-21 ENCOUNTER — Ambulatory Visit (HOSPITAL_COMMUNITY)
Admission: RE | Admit: 2014-02-21 | Discharge: 2014-02-21 | Disposition: A | Discharge status: Home / Self Care | Payer: Managed Care, Other (non HMO) | Source: Ambulatory Visit | Attending: Cardiovascular Disease | Admitting: Cardiovascular Disease

## 2014-02-21 DIAGNOSIS — Z96652 Presence of left artificial knee joint: Secondary | ICD-10-CM | POA: Diagnosis present

## 2014-02-21 DIAGNOSIS — T84043A Periprosthetic fracture around internal prosthetic left knee joint, initial encounter: Secondary | ICD-10-CM | POA: Diagnosis not present

## 2014-02-21 DIAGNOSIS — M25562 Pain in left knee: Secondary | ICD-10-CM

## 2014-02-21 DIAGNOSIS — M9712XA Periprosthetic fracture around internal prosthetic left knee joint, initial encounter: Secondary | ICD-10-CM

## 2014-02-21 NOTE — Discharge Summary (Signed)
Physician Discharge Summary   Patient ID: Peter Vang MRN: [id number] DOB/AGE: 50-28-1967 50 y.o.  Admit date: 02/15/2014 Discharge date: 02/17/2014  Primary Diagnosis: Primary osteoarthritis, left knee  Admission Diagnoses:  Past Medical History  Diagnosis Date  . COPD (chronic obstructive pulmonary disease)   . Hx of colonic polyps   . Restless leg syndrome   . Cluster headache   . Hypertension   . Ptosis     OS  . Sleep disorder   . Arthritis     DJD  . HLD (hyperlipidemia)   . Lipoma   . Anxiety   . Emphysema of lung   . GERD (gastroesophageal reflux disease)    Discharge Diagnoses:   Active Problems:   Total knee replacement status  Estimated body mass index is 27.84 kg/(m^2) as calculated from the following:   Height as of this encounter: 5' 10"  (1.778 m).   Weight as of this encounter: 87.998 kg (194 lb).  Procedure:  Procedure(s) (LRB): LEFT TOTAL KNEE ARTHROPLASTY (Left)   Consults: None  HPI: Peter Vang, 48 y.o. male, has a history of pain and functional disability in the left knee due to arthritis and has failed non-surgical conservative treatments for greater than 12 weeks to includeNSAID's and/or analgesics, corticosteriod injections, flexibility and strengthening excercises and activity modification. Onset of symptoms was gradual, starting 2 years ago with gradually worsening course since that time. The patient noted prior procedures on the knee to include arthroscopy and menisectomy on the left knee(s). Patient currently rates pain in the left knee(s) at 7 out of 10 with activity. Patient has night pain, worsening of pain with activity and weight bearing, pain that interferes with activities of daily living, pain with passive range of motion, crepitus and joint swelling. Patient has evidence of periarticular osteophytes and joint space narrowing by imaging studies. There is no active infection.  Laboratory Data: Admission on 02/15/2014,  Discharged on 02/17/2014  Component Date Value Ref Range Status  . ABO/RH(D) 02/15/2014 A POS   Final  . Antibody Screen 02/15/2014 NEG   Final  . Sample Expiration 02/15/2014 02/18/2014   Final  . ABO/RH(D) 02/15/2014 A POS   Final  . WBC 02/16/2014 12.6* 4.0 - 10.5 K/uL Final  . RBC 02/16/2014 3.64* 4.22 - 5.81 MIL/uL Final  . Hemoglobin 02/16/2014 11.7* 13.0 - 17.0 g/dL Final  . HCT 02/16/2014 34.6* 39.0 - 52.0 % Final  . MCV 02/16/2014 95.1  78.0 - 100.0 fL Final  . MCH 02/16/2014 32.1  26.0 - 34.0 pg Final  . MCHC 02/16/2014 33.8  30.0 - 36.0 g/dL Final  . RDW 02/16/2014 12.4  11.5 - 15.5 % Final  . Platelets 02/16/2014 259  150 - 400 K/uL Final  . Sodium 02/16/2014 140  137 - 147 mEq/L Final  . Potassium 02/16/2014 4.5  3.7 - 5.3 mEq/L Final  . Chloride 02/16/2014 102  96 - 112 mEq/L Final  . CO2 02/16/2014 27  19 - 32 mEq/L Final  . Glucose, Bld 02/16/2014 117* 70 - 99 mg/dL Final  . BUN 02/16/2014 16  6 - 23 mg/dL Final  . Creatinine, Ser 02/16/2014 1.09  0.50 - 1.35 mg/dL Final  . Calcium 02/16/2014 9.2  8.4 - 10.5 mg/dL Final  . GFR calc non Af Amer 02/16/2014 78* >90 mL/min Final  . GFR calc Af Amer 02/16/2014 >90  >90 mL/min Final   Comment: (NOTE) The eGFR has been calculated using the CKD EPI equation. This calculation  has not been validated in all clinical situations. eGFR's persistently <90 mL/min signify possible Chronic Kidney Disease.   . Anion gap 02/16/2014 11  5 - 15 Final  . WBC 02/17/2014 9.6  4.0 - 10.5 K/uL Final  . RBC 02/17/2014 3.54* 4.22 - 5.81 MIL/uL Final  . Hemoglobin 02/17/2014 11.4* 13.0 - 17.0 g/dL Final  . HCT 02/17/2014 34.0* 39.0 - 52.0 % Final  . MCV 02/17/2014 96.0  78.0 - 100.0 fL Final  . MCH 02/17/2014 32.2  26.0 - 34.0 pg Final  . MCHC 02/17/2014 33.5  30.0 - 36.0 g/dL Final  . RDW 02/17/2014 12.4  11.5 - 15.5 % Final  . Platelets 02/17/2014 249  150 - 400 K/uL Final  . Sodium 02/17/2014 141  137 - 147 mEq/L Final  . Potassium  02/17/2014 4.4  3.7 - 5.3 mEq/L Final  . Chloride 02/17/2014 100  96 - 112 mEq/L Final  . CO2 02/17/2014 27  19 - 32 mEq/L Final  . Glucose, Bld 02/17/2014 107* 70 - 99 mg/dL Final  . BUN 02/17/2014 21  6 - 23 mg/dL Final  . Creatinine, Ser 02/17/2014 1.17  0.50 - 1.35 mg/dL Final  . Calcium 02/17/2014 9.3  8.4 - 10.5 mg/dL Final  . GFR calc non Af Amer 02/17/2014 72* >90 mL/min Final  . GFR calc Af Amer 02/17/2014 83* >90 mL/min Final   Comment: (NOTE) The eGFR has been calculated using the CKD EPI equation. This calculation has not been validated in all clinical situations. eGFR's persistently <90 mL/min signify possible Chronic Kidney Disease.   Georgiann Hahn gap 02/17/2014 14  5 - 15 Final  Hospital Outpatient Visit on 02/07/2014  Component Date Value Ref Range Status  . WBC 02/07/2014 8.5  4.0 - 10.5 K/uL Final  . RBC 02/07/2014 4.53  4.22 - 5.81 MIL/uL Final  . Hemoglobin 02/07/2014 15.0  13.0 - 17.0 g/dL Final  . HCT 02/07/2014 42.4  39.0 - 52.0 % Final  . MCV 02/07/2014 93.6  78.0 - 100.0 fL Final  . MCH 02/07/2014 33.1  26.0 - 34.0 pg Final  . MCHC 02/07/2014 35.4  30.0 - 36.0 g/dL Final  . RDW 02/07/2014 11.8  11.5 - 15.5 % Final  . Platelets 02/07/2014 298  150 - 400 K/uL Final  . Sodium 02/07/2014 140  137 - 147 mEq/L Final  . Potassium 02/07/2014 4.5  3.7 - 5.3 mEq/L Final  . Chloride 02/07/2014 100  96 - 112 mEq/L Final  . CO2 02/07/2014 29  19 - 32 mEq/L Final  . Glucose, Bld 02/07/2014 97  70 - 99 mg/dL Final  . BUN 02/07/2014 19  6 - 23 mg/dL Final  . Creatinine, Ser 02/07/2014 1.18  0.50 - 1.35 mg/dL Final  . Calcium 02/07/2014 9.7  8.4 - 10.5 mg/dL Final  . GFR calc non Af Amer 02/07/2014 71* >90 mL/min Final  . GFR calc Af Amer 02/07/2014 82* >90 mL/min Final   Comment: (NOTE)                          The eGFR has been calculated using the CKD EPI equation.                          This calculation has not been validated in all clinical situations.  eGFR's persistently <90 mL/min signify possible Chronic Kidney                          Disease.  . Anion gap 02/07/2014 11  5 - 15 Final  . Prothrombin Time 02/07/2014 13.8  11.6 - 15.2 seconds Final  . INR 02/07/2014 1.04  0.00 - 1.49 Final  . aPTT 02/07/2014 31  24 - 37 seconds Final  . Color, Urine 02/07/2014 YELLOW  YELLOW Final  . APPearance 02/07/2014 HAZY* CLEAR Final  . Specific Gravity, Urine 02/07/2014 1.024  1.005 - 1.030 Final  . pH 02/07/2014 6.0  5.0 - 8.0 Final  . Glucose, UA 02/07/2014 100* NEGATIVE mg/dL Final  . Hgb urine dipstick 02/07/2014 NEGATIVE  NEGATIVE Final  . Bilirubin Urine 02/07/2014 NEGATIVE  NEGATIVE Final  . Ketones, ur 02/07/2014 NEGATIVE  NEGATIVE mg/dL Final  . Protein, ur 02/07/2014 NEGATIVE  NEGATIVE mg/dL Final  . Urobilinogen, UA 02/07/2014 0.2  0.0 - 1.0 mg/dL Final  . Nitrite 02/07/2014 NEGATIVE  NEGATIVE Final  . Leukocytes, UA 02/07/2014 NEGATIVE  NEGATIVE Final   MICROSCOPIC NOT DONE ON URINES WITH NEGATIVE PROTEIN, BLOOD, LEUKOCYTES, NITRITE, OR GLUCOSE <1000 mg/dL.  Marland Kitchen MRSA, PCR 02/07/2014 NEGATIVE  NEGATIVE Final  . Staphylococcus aureus 02/07/2014 NEGATIVE  NEGATIVE Final   Comment:                                 The Xpert SA Assay (FDA                          approved for NASAL specimens                          in patients over 56 years of age),                          is one component of                          a comprehensive surveillance                          program.  Test performance has                          been validated by [employer] for patients greater                          than or equal to 26 year old.                          It is not intended                          to diagnose infection nor to                          guide or monitor treatment.     X-Rays:Dg Chest  2 View  02/07/2014   CLINICAL DATA:  COPD.  Hypertension.  EXAM: CHEST  2 VIEW  COMPARISON:   12/21/2008.  FINDINGS: Mediastinum and hilar structures normal. Lungs clear. Heart size normal. No pleural effusion or pneumothorax. Left shoulder replacement.  IMPRESSION: No active cardiopulmonary disease.   Electronically Signed   By: Marcello Moores  Register   On: 02/07/2014 14:13    EKG: Orders placed or performed in visit on 03/30/13  . EKG 12-Lead     Hospital Course: Peter Vang is a 48 y.o. who was admitted to [facility]. They were brought to the operating room on 02/15/2014 and underwent Procedure(s): LEFT TOTAL KNEE ARTHROPLASTY.  Patient tolerated the procedure well and was later transferred to the recovery room and then to the orthopaedic floor for postoperative care.  They were given PO and IV analgesics for pain control following their surgery.  They were given 24 hours of postoperative antibiotics of  Anti-infectives    Start     Dose/Rate Route Frequency Ordered Stop   02/15/14 1400  ceFAZolin (ANCEF) IVPB 1 g/50 mL premix     1 g100 mL/hr over 30 Minutes Intravenous Every 6 hours 02/15/14 1229 02/15/14 2346   02/15/14 0859  polymyxin B 500,000 Units, bacitracin 50,000 Units in sodium chloride irrigation 0.9 % 500 mL irrigation  Status:  Discontinued       As needed 02/15/14 0859 02/15/14 1049   02/15/14 0650  ceFAZolin (ANCEF) IVPB 2 g/50 mL premix     2 g100 mL/hr over 30 Minutes Intravenous On call to O.R. 02/15/14 7846 02/15/14 0841     and started on DVT prophylaxis in the form of Xarelto.   PT and OT were ordered for total joint protocol.  Discharge planning consulted to help with postop disposition and equipment needs.  Patient had a difficult night on the evening of surgery due to poor pain control.  They started to get up OOB with therapy on day one. Hemovac drain was pulled without difficulty.  Continued to work with therapy into day two.  Dressing was changed on day two and the incision was clean but with mild bloody drainage. New dressing applied. Patient seen in  rounds and discharged home with HHPT.   Diet: Regular diet Activity:WBAT Follow-up:in 2 weeks Disposition - Home Discharged Condition: stable   Discharge Instructions    Call MD / Call 911    Complete by:  As directed   If you experience chest pain or shortness of breath, CALL 911 and be transported to the hospital emergency room.  If you develope a fever above 101 F, pus (white drainage) or increased drainage or redness at the wound, or calf pain, call your surgeon's office.     Constipation Prevention    Complete by:  As directed   Drink plenty of fluids.  Prune juice may be helpful.  You may use a stool softener, such as Colace (over the counter) 100 mg twice a day.  Use MiraLax (over the counter) for constipation as needed.     Diet - low sodium heart healthy    Complete by:  As directed      Discharge instructions    Complete by:  As directed   Walk with your walker. Weight bearing as tolerated [place] will follow you at home for your therapy Do not remove the aquacel dressing unless there is excess drainage. Change the dressing over the drain site daily. Shower only, no tub  bath. You may shower with the aquacel dressing on.  Call if any temperatures greater than 101 or any wound complications: [phone] during the day and ask for Dr. Charlestine Night nurse, Brunilda Payor.     Do not put a pillow under the knee. Place it under the heel.    Complete by:  As directed      Driving restrictions    Complete by:  As directed   No driving for 2 weeks     Increase activity slowly as tolerated    Complete by:  As directed             Medication List    STOP taking these medications        multivitamin with minerals Tabs tablet     oxyCODONE-acetaminophen 10-325 MG per tablet  Commonly known as:  PERCOCET      TAKE these medications        ALPRAZolam 1 MG tablet  Commonly known as:  XANAX  Take 1 mg by mouth at bedtime.     amLODipine 5 MG tablet  Commonly known  as:  NORVASC  Take 5 mg by mouth every morning.     amLODipine-atorvastatin 5-40 MG per tablet  Commonly known as:  CADUET  Take 1 tablet by mouth every morning.     buPROPion 75 MG tablet  Commonly known as:  WELLBUTRIN  Take 1 tablet (75 mg total) by mouth 2 (two) times daily.     DSS 100 MG Caps  Take 100 mg by mouth 2 (two) times daily.     losartan 100 MG tablet  Commonly known as:  COZAAR  Take 100 mg by mouth daily.     methocarbamol 500 MG tablet  Commonly known as:  ROBAXIN  Take 1 tablet (500 mg total) by mouth every 6 (six) hours as needed for muscle spasms.     oxyCODONE 5 MG immediate release tablet  Commonly known as:  Oxy IR/ROXICODONE  Take 1-4 tablets (5-20 mg total) by mouth every 3 (three) hours as needed for breakthrough pain.     polyethylene glycol packet  Commonly known as:  MIRALAX / GLYCOLAX  Take 17 g by mouth daily as needed for mild constipation.     rivaroxaban 10 MG Tabs tablet  Commonly known as:  XARELTO  Take 1 tablet (10 mg total) by mouth daily with breakfast.           Follow-up Information    Follow up with GIOFFRE,RONALD A, MD In 2 weeks.   Specialty:  Orthopedic Surgery   Contact information:   [address] [place] [address] [phone]       Follow up with [facility].   Why:  home health physical therapy   Contact information:   [place] [address] [phone]       Follow up with [facility].   Why:  3n1 (commode) and rolling walker   Contact information:   [place] [address] [phone]       Signed: Ardeen Jourdain, PA-C Orthopaedic Surgery 02/21/2014, 12:38 PM

## 2014-02-21 NOTE — Progress Notes (Signed)
Left Lower Ext. Venous Duplex Completed. Negative for DVT or SVT. Takenya Travaglini, BS, RDMS, RVT  

## 2014-02-22 ENCOUNTER — Other Ambulatory Visit: Payer: Self-pay | Admitting: Internal Medicine

## 2014-02-22 NOTE — Telephone Encounter (Signed)
Last office visit 11/30/2013

## 2014-02-22 NOTE — Telephone Encounter (Signed)
Alprazolam has been called to [facility] on [address]

## 2014-02-22 NOTE — Telephone Encounter (Signed)
OK X1 

## 2014-02-24 ENCOUNTER — Telehealth (HOSPITAL_COMMUNITY): Payer: Self-pay | Admitting: *Deleted

## 2014-03-22 ENCOUNTER — Other Ambulatory Visit: Payer: Self-pay | Admitting: Internal Medicine

## 2014-03-22 ENCOUNTER — Emergency Department (HOSPITAL_COMMUNITY)
Admission: EM | Admit: 2014-03-22 | Discharge: 2014-03-22 | Disposition: A | Discharge status: Home / Self Care | Payer: Managed Care, Other (non HMO) | Attending: Emergency Medicine | Admitting: Emergency Medicine

## 2014-03-22 ENCOUNTER — Encounter (HOSPITAL_COMMUNITY): Payer: Self-pay

## 2014-03-22 ENCOUNTER — Emergency Department (HOSPITAL_COMMUNITY): Payer: Managed Care, Other (non HMO)

## 2014-03-22 DIAGNOSIS — Z720 Tobacco use: Secondary | ICD-10-CM | POA: Diagnosis not present

## 2014-03-22 DIAGNOSIS — F419 Anxiety disorder, unspecified: Secondary | ICD-10-CM | POA: Insufficient documentation

## 2014-03-22 DIAGNOSIS — Z8719 Personal history of other diseases of the digestive system: Secondary | ICD-10-CM | POA: Diagnosis not present

## 2014-03-22 DIAGNOSIS — Y9289 Other specified places as the place of occurrence of the external cause: Secondary | ICD-10-CM | POA: Insufficient documentation

## 2014-03-22 DIAGNOSIS — Z8669 Personal history of other diseases of the nervous system and sense organs: Secondary | ICD-10-CM | POA: Insufficient documentation

## 2014-03-22 DIAGNOSIS — E785 Hyperlipidemia, unspecified: Secondary | ICD-10-CM | POA: Diagnosis not present

## 2014-03-22 DIAGNOSIS — Y998 Other external cause status: Secondary | ICD-10-CM | POA: Insufficient documentation

## 2014-03-22 DIAGNOSIS — I10 Essential (primary) hypertension: Secondary | ICD-10-CM | POA: Insufficient documentation

## 2014-03-22 DIAGNOSIS — Z7901 Long term (current) use of anticoagulants: Secondary | ICD-10-CM | POA: Insufficient documentation

## 2014-03-22 DIAGNOSIS — Z86010 Personal history of colonic polyps: Secondary | ICD-10-CM | POA: Diagnosis not present

## 2014-03-22 DIAGNOSIS — X58XXXA Exposure to other specified factors, initial encounter: Secondary | ICD-10-CM | POA: Insufficient documentation

## 2014-03-22 DIAGNOSIS — J439 Emphysema, unspecified: Secondary | ICD-10-CM | POA: Insufficient documentation

## 2014-03-22 DIAGNOSIS — G8929 Other chronic pain: Secondary | ICD-10-CM | POA: Diagnosis not present

## 2014-03-22 DIAGNOSIS — M1990 Unspecified osteoarthritis, unspecified site: Secondary | ICD-10-CM | POA: Diagnosis not present

## 2014-03-22 DIAGNOSIS — Z008 Encounter for other general examination: Secondary | ICD-10-CM

## 2014-03-22 DIAGNOSIS — S4991XA Unspecified injury of right shoulder and upper arm, initial encounter: Secondary | ICD-10-CM | POA: Insufficient documentation

## 2014-03-22 DIAGNOSIS — Z0000 Encounter for general adult medical examination without abnormal findings: Secondary | ICD-10-CM | POA: Insufficient documentation

## 2014-03-22 DIAGNOSIS — Z79899 Other long term (current) drug therapy: Secondary | ICD-10-CM | POA: Insufficient documentation

## 2014-03-22 DIAGNOSIS — M25511 Pain in right shoulder: Secondary | ICD-10-CM

## 2014-03-22 DIAGNOSIS — Z96619 Presence of unspecified artificial shoulder joint: Secondary | ICD-10-CM | POA: Diagnosis not present

## 2014-03-22 DIAGNOSIS — Y9389 Activity, other specified: Secondary | ICD-10-CM | POA: Diagnosis not present

## 2014-03-22 MED ORDER — HYDROCODONE-ACETAMINOPHEN 5-325 MG PO TABS
2.0000 | ORAL_TABLET | Freq: Once | ORAL | Status: AC
Start: 2014-03-22 — End: 2014-03-22
  Administered 2014-03-22: 2 via ORAL
  Filled 2014-03-22: qty 2

## 2014-03-22 MED ORDER — HYDROCODONE-ACETAMINOPHEN 5-325 MG PO TABS: 1.0000 | ORAL_TABLET | Freq: Four times a day (QID) | ORAL | Status: DC | PRN

## 2014-03-22 NOTE — Telephone Encounter (Signed)
Alprazolam has been called to [facility]
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
## 2014-03-22 NOTE — Discharge Instructions (Signed)
Arthralgia °Your caregiver has diagnosed you as suffering from an arthralgia. Arthralgia means there is pain in a joint. This can come from many reasons including: °· Bruising the joint which causes soreness (inflammation) in the joint. °· Wear and tear on the joints which occur as we grow older (osteoarthritis). °· Overusing the joint. °· Various forms of arthritis. °· Infections of the joint. °Regardless of the cause of pain in your joint, most of these different pains respond to anti-inflammatory drugs and rest. The exception to this is when a joint is infected, and these cases are treated with antibiotics, if it is a bacterial infection. °HOME CARE INSTRUCTIONS  °· Rest the injured area for as long as directed by your caregiver. Then slowly start using the joint as directed by your caregiver and as the pain allows. Crutches as directed may be useful if the ankles, knees or hips are involved. If the knee was splinted or casted, continue use and care as directed. If an stretchy or elastic wrapping bandage has been applied today, it should be removed and re-applied every 3 to 4 hours. It should not be applied tightly, but firmly enough to keep swelling down. Watch toes and feet for swelling, bluish discoloration, coldness, numbness or excessive pain. If any of these problems (symptoms) occur, remove the ace bandage and re-apply more loosely. If these symptoms persist, contact your caregiver or return to this location. °· For the first 24 hours, keep the injured extremity elevated on pillows while lying down. °· Apply ice for 15-20 minutes to the sore joint every couple hours while awake for the first half day. Then 03-04 times per day for the first 48 hours. Put the ice in a plastic bag and place a towel between the bag of ice and your skin. °· Wear any splinting, casting, elastic bandage applications, or slings as instructed. °· Only take over-the-counter or prescription medicines for pain, discomfort, or fever as  directed by your caregiver. Do not use aspirin immediately after the injury unless instructed by your physician. Aspirin can cause increased bleeding and bruising of the tissues. °· If you were given crutches, continue to use them as instructed and do not resume weight bearing on the sore joint until instructed. °Persistent pain and inability to use the sore joint as directed for more than 2 to 3 days are warning signs indicating that you should see a caregiver for a follow-up visit as soon as possible. Initially, a hairline fracture (break in bone) may not be evident on X-rays. Persistent pain and swelling indicate that further evaluation, non-weight bearing or use of the joint (use of crutches or slings as instructed), or further X-rays are indicated. X-rays may sometimes not show a small fracture until a week or 10 days later. Make a follow-up appointment with your own caregiver or one to whom we have referred you. A radiologist (specialist in reading X-rays) may read your X-rays. Make sure you know how you are to obtain your X-ray results. Do not assume everything is normal if you do not hear from us. °SEEK MEDICAL CARE IF: °Bruising, swelling, or pain increases. °SEEK IMMEDIATE MEDICAL CARE IF:  °· Your fingers or toes are numb or blue. °· The pain is not responding to medications and continues to stay the same or get worse. °· The pain in your joint becomes severe. °· You develop a fever over 102° F (38.9° C). °· It becomes impossible to move or use the joint. °MAKE SURE YOU:  °·   Understand these instructions.  Will watch your condition.  Will get help right away if you are not doing well or get worse. Document Released: 03/31/2005 Document Revised: 06/23/2011 Document Reviewed: 11/17/2007 [facility] Patient Information 2015 [place], [place]. This information is not intended to replace advice given to you by your health care provider. Make sure you discuss any questions you have with your health care  provider.  Acromioclavicular Injuries The AC (acromioclavicular) joint is the joint in the shoulder where the collarbone (clavicle) meets the shoulder blade (scapula). The part of the shoulder blade connected to the collarbone is called the acromion. Common problems with and treatments for the [facility] joint are detailed below. ARTHRITIS Arthritis occurs when the joint has been injured and the smooth padding between the joints (cartilage) is lost. This is the wear and tear seen in most joints of the body if they have been overused. This causes the joint to produce pain and swelling which is worse with activity.  AC JOINT SEPARATION AC joint separation means that the ligaments connecting the acromion of the shoulder blade and collarbone have been damaged, and the two bones no longer line up. AC separations can be anywhere from mild to severe, and are "graded" depending upon which ligaments are torn and how badly they are torn.  Grade I Injury: the least damage is done, and the [facility] joint still lines up.  Grade II Injury: damage to the ligaments which reinforce the [facility] joint. In a Grade II injury, these ligaments are stretched but not entirely torn. When stressed, the [facility] joint becomes painful and unstable.  Grade III Injury: AC and secondary ligaments are completely torn, and the collarbone is no longer attached to the shoulder blade. This results in deformity; a prominence of the end of the clavicle. AC JOINT FRACTURE AC joint fracture means that there has been a break in the bones of the [facility] joint, usually the end of the clavicle. TREATMENT TREATMENT OF AC ARTHRITIS  There is currently no way to replace the cartilage damaged by arthritis. The best way to improve the condition is to decrease the activities which aggravate the problem. Application of ice to the joint helps decrease pain and soreness (inflammation). The use of non-steroidal anti-inflammatory medication is helpful.  If less conservative  measures do not work, then cortisone shots (injections) may be used. These are anti-inflammatories; they decrease the soreness in the joint and swelling.  If non-surgical measures fail, surgery may be recommended. The procedure is generally removal of a portion of the end of the clavicle. This is the part of the collarbone closest to your acromion which is stabilized with ligaments to the acromion of the shoulder blade. This surgery may be performed using a tube-like instrument with a light (arthroscope) for looking into a joint. It may also be performed as an open surgery through a small incision by the surgeon. Most patients will have good range of motion within 6 weeks and may return to all activity including sports by 8-12 weeks, barring complications. TREATMENT OF AN AC SEPARATION  The initial treatment is to decrease pain. This is best accomplished by immobilizing the arm in a sling and placing an ice pack to the shoulder for 20 to 30 minutes every 2 hours as needed. As the pain starts to subside, it is important to begin moving the fingers, wrist, elbow and eventually the shoulder in order to prevent a stiff or "frozen" shoulder. Instruction on when and how much to move the shoulder  will be provided by your caregiver. The length of time needed to regain full motion and function depends on the amount or grade of the injury. Recovery from a Grade I AC separation usually takes 10 to 14 days, whereas a Grade III may take 6 to 8 weeks.  Grade I and II separations usually do not require surgery. Even Grade III injuries usually allow return to full activity with few restrictions. Treatment is also based on the activity demands of the injured shoulder. For example, a high level quarterback with an injured throwing arm will receive more aggressive treatment than someone with a desk job who rarely uses his/her arm for strenuous activities. In some cases, a painful lump may persist which could require a later  surgery. Surgery can be very successful, but the benefits must be weighed against the potential risks. TREATMENT OF AN AC JOINT FRACTURE Fracture treatment depends on the type of fracture. Sometimes a splint or sling may be all that is required. Other times surgery may be required for repair. This is more frequently the case when the ligaments supporting the clavicle are completely torn. Your caregiver will help you with these decisions and together you can decide what will be the best treatment. HOME CARE INSTRUCTIONS   Apply ice to the injury for 15-20 minutes each hour while awake for 2 days. Put the ice in a plastic bag and place a towel between the bag of ice and skin.  If a sling has been applied, wear it constantly for as long as directed by your caregiver, even at night. The sling or splint can be removed for bathing or showering or as directed. Be sure to keep the shoulder in the same place as when the sling is on. Do not lift the arm.  If a figure-of-eight splint has been applied it should be tightened gently by another person every day. Tighten it enough to keep the shoulders held back. Allow enough room to place the index finger between the body and strap. Loosen the splint immediately if there is numbness or tingling in the hands.  Take over-the-counter or prescription medicines for pain, discomfort or fever as directed by your caregiver.  If you or your child has received a follow up appointment, it is very important to keep that appointment in order to avoid long term complications, chronic pain or disability. SEEK MEDICAL CARE IF:   The pain is not relieved with medications.  There is increased swelling or discoloration that continues to get worse rather than better.  You or your child has been unable to follow up as instructed.  There is progressive numbness and tingling in the arm, forearm or hand. SEEK IMMEDIATE MEDICAL CARE IF:   The arm is numb, cold or pale.  There is  increasing pain in the hand, forearm or fingers. MAKE SURE YOU:   Understand these instructions.  Will watch your condition.  Will get help right away if you are not doing well or get worse. Document Released: 01/08/2005 Document Revised: 06/23/2011 Document Reviewed: 07/03/2008 [facility] Patient Information 2015 [place], [place]. This information is not intended to replace advice given to you by your health care provider. Make sure you discuss any questions you have with your health care provider.

## 2014-03-22 NOTE — Telephone Encounter (Signed)
OK X1 

## 2014-03-22 NOTE — ED Notes (Signed)
Pt was reaching above his head working on a light fixture. Heard a pop and felt a crunch. Unable to finish what he was doing. Pt with pain in right shoulder.

## 2014-03-27 ENCOUNTER — Encounter: Payer: Self-pay | Admitting: Internal Medicine

## 2014-04-03 ENCOUNTER — Other Ambulatory Visit: Payer: Self-pay | Admitting: Internal Medicine

## 2014-04-03 NOTE — Telephone Encounter (Signed)
OK X1, R X 2

## 2014-04-04 ENCOUNTER — Ambulatory Visit: Payer: Managed Care, Other (non HMO)

## 2014-04-04 ENCOUNTER — Ambulatory Visit: Payer: Managed Care, Other (non HMO) | Attending: Orthopedic Surgery

## 2014-04-18 ENCOUNTER — Emergency Department (HOSPITAL_COMMUNITY)
Admission: EM | Admit: 2014-04-18 | Discharge: 2014-04-18 | Disposition: A | Discharge status: Home / Self Care | Payer: Managed Care, Other (non HMO) | Attending: Emergency Medicine | Admitting: Emergency Medicine

## 2014-04-18 ENCOUNTER — Ambulatory Visit: Payer: Managed Care, Other (non HMO) | Admitting: Physical Therapy

## 2014-04-18 ENCOUNTER — Encounter (HOSPITAL_COMMUNITY): Payer: Self-pay

## 2014-04-18 DIAGNOSIS — Z720 Tobacco use: Secondary | ICD-10-CM | POA: Insufficient documentation

## 2014-04-18 DIAGNOSIS — G2581 Restless legs syndrome: Secondary | ICD-10-CM | POA: Diagnosis not present

## 2014-04-18 DIAGNOSIS — M1990 Unspecified osteoarthritis, unspecified site: Secondary | ICD-10-CM | POA: Diagnosis not present

## 2014-04-18 DIAGNOSIS — F1110 Opioid abuse, uncomplicated: Secondary | ICD-10-CM | POA: Insufficient documentation

## 2014-04-18 DIAGNOSIS — I10 Essential (primary) hypertension: Secondary | ICD-10-CM | POA: Diagnosis not present

## 2014-04-18 DIAGNOSIS — G8929 Other chronic pain: Secondary | ICD-10-CM | POA: Insufficient documentation

## 2014-04-18 DIAGNOSIS — Z86010 Personal history of colonic polyps: Secondary | ICD-10-CM | POA: Insufficient documentation

## 2014-04-18 DIAGNOSIS — Z7901 Long term (current) use of anticoagulants: Secondary | ICD-10-CM | POA: Diagnosis not present

## 2014-04-18 DIAGNOSIS — F419 Anxiety disorder, unspecified: Secondary | ICD-10-CM | POA: Insufficient documentation

## 2014-04-18 DIAGNOSIS — Z79899 Other long term (current) drug therapy: Secondary | ICD-10-CM | POA: Diagnosis not present

## 2014-04-18 DIAGNOSIS — E785 Hyperlipidemia, unspecified: Secondary | ICD-10-CM | POA: Insufficient documentation

## 2014-04-18 DIAGNOSIS — F1210 Cannabis abuse, uncomplicated: Secondary | ICD-10-CM | POA: Insufficient documentation

## 2014-04-18 DIAGNOSIS — J449 Chronic obstructive pulmonary disease, unspecified: Secondary | ICD-10-CM | POA: Insufficient documentation

## 2014-04-18 DIAGNOSIS — Z8739 Personal history of other diseases of the musculoskeletal system and connective tissue: Secondary | ICD-10-CM | POA: Diagnosis not present

## 2014-04-18 DIAGNOSIS — F1910 Other psychoactive substance abuse, uncomplicated: Secondary | ICD-10-CM

## 2014-04-18 LAB — CBC
HCT: 41.5 % (ref 39.0–52.0)
HEMOGLOBIN: 13.5 g/dL (ref 13.0–17.0)
MCH: 30.5 pg (ref 26.0–34.0)
MCHC: 32.5 g/dL (ref 30.0–36.0)
MCV: 93.9 fL (ref 78.0–100.0)
Platelets: 317 10*3/uL (ref 150–400)
RBC: 4.42 MIL/uL (ref 4.22–5.81)
RDW: 14.3 % (ref 11.5–15.5)
WBC: 9.8 10*3/uL (ref 4.0–10.5)

## 2014-04-18 LAB — COMPREHENSIVE METABOLIC PANEL
ALBUMIN: 3.9 g/dL (ref 3.5–5.2)
ALT: 20 U/L (ref 0–53)
AST: 17 U/L (ref 0–37)
Alkaline Phosphatase: 110 U/L (ref 39–117)
Anion gap: 10 (ref 5–15)
BUN: 17 mg/dL (ref 6–23)
CALCIUM: 9.1 mg/dL (ref 8.4–10.5)
CO2: 28 mmol/L (ref 19–32)
CREATININE: 1.05 mg/dL (ref 0.50–1.35)
Chloride: 97 mEq/L (ref 96–112)
GFR calc Af Amer: >90 mL/min (ref >90)
GFR, EST NON AFRICAN AMERICAN: 81 mL/min — AB (ref >90)
Glucose, Bld: 102 mg/dL — ABNORMAL HIGH (ref 70–99)
Potassium: 4.1 mmol/L (ref 3.5–5.1)
Sodium: 135 mmol/L (ref 135–145)
Total Bilirubin: 0.4 mg/dL (ref 0.3–1.2)
Total Protein: 7.2 g/dL (ref 6.0–8.3)

## 2014-04-18 LAB — RAPID URINE DRUG SCREEN, HOSP PERFORMED
AMPHETAMINES: NOT DETECTED
Barbiturates: NOT DETECTED
Benzodiazepines: NOT DETECTED
COCAINE: NOT DETECTED
Opiates: POSITIVE — AB
Tetrahydrocannabinol: POSITIVE — AB

## 2014-04-18 LAB — ACETAMINOPHEN LEVEL: Acetaminophen (Tylenol), Serum: <10 ug/mL — ABNORMAL LOW (ref 10–30)

## 2014-04-18 LAB — SALICYLATE LEVEL

## 2014-04-18 LAB — ETHANOL: Alcohol, Ethyl (B): <5 mg/dL (ref 0–9)

## 2014-04-18 MED ORDER — ROPINIROLE HCL 0.5 MG PO TABS: 0.5000 mg | ORAL_TABLET | Freq: Every day | ORAL | Status: DC

## 2014-04-18 MED ORDER — CLONAZEPAM 0.5 MG PO TABS: 1.0000 mg | ORAL_TABLET | Freq: Every day | ORAL | Status: DC

## 2014-04-18 NOTE — Discharge Instructions (Signed)
°Emergency Department Resource Guide °1) Find a Doctor and Pay Out of Pocket °Although you won't have to find out who is covered by your insurance plan, it is a good idea to ask around and get recommendations. You will then need to call the office and see if the doctor you have chosen will accept you as a new patient and what types of options they offer for patients who are self-pay. Some doctors offer discounts or will set up payment plans for their patients who do not have insurance, but you will need to ask so you aren't surprised when you get to your appointment. ° °2) Contact Your Local Health Department °Not all health departments have doctors that can see patients for sick visits, but many do, so it is worth a call to see if yours does. If you don't know where your local health department is, you can check in your phone book. The CDC also has a tool to help you locate your state's health department, and many state websites also have listings of all of their local health departments. ° °3) Find a Walk-in Clinic °If your illness is not likely to be very severe or complicated, you may want to try a walk in clinic. These are popping up all over the country in pharmacies, drugstores, and shopping centers. They're usually staffed by nurse practitioners or physician assistants that have been trained to treat common illnesses and complaints. They're usually fairly quick and inexpensive. However, if you have serious medical issues or chronic medical problems, these are probably not your best option. ° °No Primary Care Doctor: °- Call [facility] at  832-8000 - they can help you locate a primary care doctor that  accepts your insurance, provides certain services, etc. °- Physician Referral Service- 1-800-533-3463 ° °Chronic Pain Problems: °Organization         Address  Phone   Notes  °[facility]  (336) 297-2271 Patients need to be referred by their primary care doctor.  ° °Medication  Assistance: °Organization         Address  Phone   Notes  °[place] [facility] [address] °[place], [place] [address] (336) 641-8030 --Must be a resident of [place] °-- Must have NO insurance coverage whatsoever (no Medicaid/ Medicare, etc.) °-- The pt. MUST have a primary care doctor that directs their care regularly and follows them in the community °  °[facility]  (866) 331-1348   °[employer]  (888) 892-1162   ° °Agencies that provide inexpensive medical care: °Organization         Address  Phone   Notes  °[facility]  (336) 832-8035   °[facility]    (336) 832-7272   °[facility] [address] °[place], [place] [address] (336) 832-4777   °[facility] [facility] [address], °[place] (336) 271-4999   °[facility]    (336) 373-0678   °[facility]    (336) 272-1050   °[facility] ° [address], [place] [employer] Phone:  (336) 832-4444, Fax:  (336) 832-4440 Hours of Operation:  9 am - 6 pm, M-F.  Also accepts Medicaid/Medicare and self-pay.  °[facility] ° [address], [place] Phone: (336) 832-3150, Fax: (336) [phone]. Hours of Operation:  8:30 am - 5:30 pm, M-F.  Also accepts Medicaid and self-pay.  °[facility] [place] [address], [place] Phone: (336) 878-6027   °[facility] [address], [place], [place] (336)723-1848, Ext. 123 Mondays & Thursdays: 7-9 AM.  First 15 patients are seen on a first come, first serve basis. °  ° °Medicaid-accepting [place] Providers: ° °Organization         Address  Phone   Notes  °[facility] [address], [place] (336) 641-2100 Also accepts self-pay patients.  °[facility] [address], [place] ° (336) 856-9996   °[facility] [address], [place]  (336) 288-8857   °[facility] [address] [place] [address], [place] (336) 299-7000   °Veita Bland [address], [place]  ° (336) 373-1557 Only accepts [place] Access Medicaid patients after they have their name applied to their card.  ° °Self-Pay (no insurance) in [place]: ° °Organization         Address  Phone   Notes  °Sickle Cell Patients, [facility] [address], [place] (336) 832-1970   °[facility] [address], [place] (336) 832-4400   °[facility] [place] ° [address], [place] (336) 992-4800   °[facility]/Dr. Osei-Bonsu ° [address] [place] [address], [place] or [address], [place] (336) 841-8500 Phone number for both [place] and [place] locations is the same.  °[facility] [address], [place] (336) 299-0000   °[facility] [address] [place] [address], [place] or [address] (336) 852-7530 °(336) 878-2260   °[facility] [address], [place] (336) 350-1642, phone; (336) 294-5005, fax Sees patients 1st and 3rd Saturday of every month.  Must not qualify for public or private insurance (i.e. Medicaid, Medicare, [place] Health Choice, Veterans' Benefits) • Household income should be no more than 200% of the poverty level •The clinic cannot treat you if you are pregnant or think you are pregnant • Sexually transmitted diseases are not treated at the clinic.  ° ° °Dental Care: °Organization         Address  Phone  Notes  °[place] [facility] [address], [place] (336) 641-6152 Accepts children up to age 21 who are enrolled in Medicaid or [place] Health Choice; pregnant women with a Medicaid card; and children who have applied for Medicaid or [place] Health Choice, but were declined, whose parents can pay a reduced fee at time of service.  °[place]  [facility] [place]  [address], [place] (336) 641-7733 Accepts children up to age 21 who are enrolled in Medicaid or [place] Health Choice; pregnant women with a Medicaid card; and children who have applied for Medicaid or  Health Choice, but were declined, whose parents can pay a reduced fee at time of service.  °[facility] ° [address],  (336) 641-4533 Patients are seen by appointment only. Walk-ins are not accepted. [facility] will see patients 18 years of age and older. °Monday - Tuesday (8am-5pm) °Most Wednesdays (8:30-5pm) °$30 per visit, cash only  °[facility] ° [address], [place] (336) 641-4533 Patients are seen by appointment only. Walk-ins are not accepted. [facility] will see patients 18 years of age and older. °One   Wednesday Evening (Monthly: Volunteer Based).  $30 per visit, cash only  °[facility]  (919) 537-3737 for adults; Children under age 4, call Graduate Pediatric Dentistry at ([phone]) [phone]. Children aged 4-14, please call ([phone]) [phone] to request a pediatric application. ° Dental services are provided in all areas of dental care including fillings, crowns and bridges, complete and partial dentures, implants, gum treatment, root canals, and extractions. Preventive care is also provided. Treatment is provided to both adults and children. °Patients are selected via a lottery and there is often a waiting list. °  °[facility] [address], °[place] ° (336) 763-8833 [email or web address] °  °[facility] [address], [place], [place] (336)723-1848, Ext. 123 Second and Fourth Thursday of each month, opens at 6:30 AM; Clinic ends at 9 AM.  Patients are seen on a first-come first-served basis, and a limited number are seen during each clinic.  ° °[facility] ° [address], [place], [place] (336) 723-7904    Eligibility Requirements °You must have lived in [place], [place], or [place] counties for at least the last three months. °  You cannot be eligible for state or federal sponsored healthcare insurance, including Veterans Administration, Medicaid, or Medicare. °  You generally cannot be eligible for healthcare insurance through your employer.  °  How to apply: °Eligibility screenings are held every Tuesday and Wednesday afternoon from 1:00 pm until 4:00 pm. You do not need an appointment for the interview!  °[facility] [address], [place], [place] 336-631-2330   °[facility] [facility]  336-342-8273   °[facility]  336-703-3100   °[facility]  336-570-6415   ° °Behavioral Health Resources in the Community: °Intensive Outpatient Programs °Organization         Address  Phone  Notes  °[facility] [address], [place], [place] 336-878-6098   °[facility] [address], [place], [place] 336-832-9800   °[facility] [address], [place], [place] ° 336-882-2125   °[facility] [address],  °[place], [place] 1-800-853-5163 or 336-641-4981   °Substance Abuse Resources °Organization         Address  Phone  Notes  °[facility]  336-882-2125   °[facility]  336-784-9470   °[facility]  336-285-9073   °[facility]  336-845-3988   °Residential & Outpatient Substance Abuse Program  1-800-659-3381   °Psychological Services °Organization         Address  Phone  Notes  °[facility]  336- 832-9600   °[facility]  336- 378-7881   °[facility] [address], [place] 1-800-853-5163 or 336-641-4981   ° °Mobile Crisis Teams °Organization         Address  Phone  Notes  °[facility], Mobile Crisis Care Unit  1-877-626-1772   °Assertive °[facility] ° [address]  [place], [place] 336-834-9664   °Sharon DeEsch [address] °[place] 336-554-5454   ° °Self-Help/Support Groups °Organization         Address  Phone             Notes  °[facility] - variety of support groups  336- 373-1402 Call for more information  °Narcotics Anonymous (NA), [facility] [address], °[place]  2 meetings at this location  ° °  Residential Treatment Programs °Organization         Address  Phone  Notes  °[facility] [address],    °[place]  1-866-801-8205   °[facility] ° [address], [place], [place] 704-293-8524   °[facility] [address], [place] 336-845-3988 Admissions: 8am-3pm M-F  °[facility] [address]    °[place], [place] 336-841-1104   °[facility] [address], [place], [place] 336-379-7146   °[facility] [address]  °[place], [place] 336-285-9073   °[facility] - Intensive Outpatient [address], [place], [place] 336-852-3033   °[facility] [address]  °[place], [place] 1-877-615-2722 or 336-784-9470   °[facility] [address] [place], [place] 336-227-7417 Accepts Medicaid  °[facility] [address]  ° [place] 1-800-659-3381 Substance Abuse/Addiction Treatment  ° °[place] Behavioral Health Resources °Organization         Address  Phone  Notes  °[facility]  (888) 581-9988   °Julie Brannon, PhD [address] [place], [place]   (336) 349-5553 or (336) 951-0000   °[facility]   [address] °[place], [place] (336) 349-4454   °[facility] [address], [place], [place] (336) 342-8316 Insurance/Medicaid/sponsorship through [facility]  °[facility] [address]                                    [place], [place] (336) 342-8316 Therapy/tele-psych/case    °[facility] [address]  ° [place], [place] (336) 349-2233    °Dr. Arfeen  (336) 349-4544   °[facility] [place]  [employer] [place] [facility] 1) [address], [place] °2) [address], [place] °3)  [address], [place] (336) 349-3220 °(336) 342-7768 ° °(336) 342-8140   °[place] [facility] (336) 342-1394 or (336) 342-3537 (After Hours)    ° ° °

## 2014-04-18 NOTE — ED Notes (Signed)
Pt was noted to have left building to smoke after being advised he was not allowed to do so. CN made aware

## 2014-04-18 NOTE — ED Notes (Signed)
Per pt, been on narcotics for a "long time"  3 years.  Couple earlier today.  Wants off.  Not sleeping well
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
## 2014-04-20 ENCOUNTER — Other Ambulatory Visit: Payer: Self-pay | Admitting: Internal Medicine

## 2014-04-20 ENCOUNTER — Ambulatory Visit: Payer: Managed Care, Other (non HMO) | Attending: Orthopedic Surgery

## 2014-04-20 ENCOUNTER — Encounter (HOSPITAL_COMMUNITY): Payer: Self-pay | Admitting: *Deleted

## 2014-04-20 ENCOUNTER — Emergency Department (HOSPITAL_COMMUNITY)
Admission: EM | Admit: 2014-04-20 | Discharge: 2014-04-20 | Disposition: A | Discharge status: Home / Self Care | Payer: Managed Care, Other (non HMO) | Attending: Emergency Medicine | Admitting: Emergency Medicine

## 2014-04-20 DIAGNOSIS — Z86018 Personal history of other benign neoplasm: Secondary | ICD-10-CM | POA: Diagnosis not present

## 2014-04-20 DIAGNOSIS — F419 Anxiety disorder, unspecified: Secondary | ICD-10-CM | POA: Diagnosis not present

## 2014-04-20 DIAGNOSIS — I10 Essential (primary) hypertension: Secondary | ICD-10-CM | POA: Insufficient documentation

## 2014-04-20 DIAGNOSIS — M1990 Unspecified osteoarthritis, unspecified site: Secondary | ICD-10-CM | POA: Diagnosis not present

## 2014-04-20 DIAGNOSIS — J449 Chronic obstructive pulmonary disease, unspecified: Secondary | ICD-10-CM | POA: Insufficient documentation

## 2014-04-20 DIAGNOSIS — F1193 Opioid use, unspecified with withdrawal: Secondary | ICD-10-CM

## 2014-04-20 DIAGNOSIS — G479 Sleep disorder, unspecified: Secondary | ICD-10-CM | POA: Insufficient documentation

## 2014-04-20 DIAGNOSIS — F1123 Opioid dependence with withdrawal: Secondary | ICD-10-CM | POA: Insufficient documentation

## 2014-04-20 DIAGNOSIS — Z720 Tobacco use: Secondary | ICD-10-CM | POA: Diagnosis not present

## 2014-04-20 DIAGNOSIS — E785 Hyperlipidemia, unspecified: Secondary | ICD-10-CM | POA: Diagnosis not present

## 2014-04-20 DIAGNOSIS — Z79899 Other long term (current) drug therapy: Secondary | ICD-10-CM | POA: Insufficient documentation

## 2014-04-20 DIAGNOSIS — Z86010 Personal history of colonic polyps: Secondary | ICD-10-CM | POA: Insufficient documentation

## 2014-04-20 DIAGNOSIS — Z8719 Personal history of other diseases of the digestive system: Secondary | ICD-10-CM | POA: Insufficient documentation

## 2014-04-20 DIAGNOSIS — F1110 Opioid abuse, uncomplicated: Secondary | ICD-10-CM | POA: Diagnosis present

## 2014-04-20 MED ORDER — METHOCARBAMOL 500 MG PO TABS
1000.0000 mg | ORAL_TABLET | Freq: Once | ORAL | Status: AC
  Administered 2014-04-20: 1000 mg via ORAL
  Filled 2014-04-20: qty 2

## 2014-04-20 MED ORDER — LORAZEPAM 2 MG/ML IJ SOLN
1.0000 mg | Freq: Once | INTRAMUSCULAR | Status: AC
  Administered 2014-04-20: 1 mg via INTRAMUSCULAR
  Filled 2014-04-20: qty 1

## 2014-04-20 MED ORDER — METHOCARBAMOL 750 MG PO TABS: 750.0000 mg | ORAL_TABLET | Freq: Four times a day (QID) | ORAL | Status: DC | PRN

## 2014-04-20 MED ORDER — KETOROLAC TROMETHAMINE 60 MG/2ML IM SOLN
60.0000 mg | Freq: Once | INTRAMUSCULAR | Status: AC
  Administered 2014-04-20: 60 mg via INTRAMUSCULAR
  Filled 2014-04-20: qty 2

## 2014-04-20 MED ORDER — CLONAZEPAM 0.5 MG PO TABS: 1.0000 mg | ORAL_TABLET | Freq: Every day | ORAL | Status: DC

## 2014-04-20 NOTE — Telephone Encounter (Signed)
#  15 He needs to make appointment to discuss
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
## 2014-04-20 NOTE — ED Notes (Signed)
Dr. Kohut at bedside 

## 2014-04-20 NOTE — ED Notes (Signed)
Pt c/o withdrawal symptoms from opiates. Pt was seen at [facility] for similar complaints on the 1/5. Pt reports he can't sit still, twitching in arms, aching and cold. Pt states he was given a list of numbers to call. Pt states they want him to go into 30 day program, but doesn't have time for a 30 day program. Pt denies SI/HI

## 2014-04-20 NOTE — Discharge Instructions (Signed)
Read the information below.  Use the prescribed medication as directed.  Please discuss all new medications with your pharmacist.  You may return to the Emergency Department at any time for worsening condition or any new symptoms that concern you.  If at any time you feel unsafe or have thoughts of hurting yourself or others, call 911 or go to your nearest emergency department.    Opioid Withdrawal Opioids are a group of narcotic drugs. They include the street drug heroin. They also include pain medicines, such as morphine, hydrocodone, oxycodone, and fentanyl. Opioid withdrawal is a group of characteristic physical and mental signs and symptoms. It typically occurs if you have been using opioids daily for several weeks or longer and stop using or rapidly decrease use. Opioid withdrawal can also occur if you have used opioids daily for a long time and are given a medicine to block the effect.  SIGNS AND SYMPTOMS Opioid withdrawal includes three or more of the following symptoms:   Depressed, anxious, or irritable mood.  Nausea or vomiting.  Muscle aches or spasms.   Watery eyes.   Runny nose.  Dilated pupils, sweating, or hairs standing on end.  Diarrhea or intestinal cramping.  Yawning.   Fever.  Increased blood pressure.  Fast pulse.  Restlessness or trouble sleeping. These signs and symptoms occur within several hours of stopping or reducing short-acting opioids, such as heroin. They can occur within 3 days of stopping or reducing long-acting opioids, such as methadone. Withdrawal begins within minutes of receiving a drug that blocks the effects of opioids, such as naltrexone or naloxone. DIAGNOSIS  Opioid use disorder is diagnosed by your health care provider. You will be asked about your symptoms, drug and alcohol use, medical history, and use of medicines. A physical exam may be done. Lab tests may be ordered. Your health care provider may have you see a mental health  professional.  TREATMENT  The treatment for opioid withdrawal is usually provided by medical doctors with special training in substance use disorders (addiction specialists). The following medicines may be included in treatment:  Opioids given in place of the abused opioid. They turn on opioid receptors in the brain and lessen or prevent withdrawal symptoms. They are gradually decreased (opioid substitution and taper).  Non-opioids that can lessen certain opioid withdrawal symptoms. They may be used alone or with opioid substitution and taper. Successful long-term recovery usually requires medicine, counseling, and group support. HOME CARE INSTRUCTIONS   Take medicines only as directed by your health care provider.  Check with your health care provider before starting new medicines.  Keep all follow-up visits as directed by your health care provider. SEEK MEDICAL CARE IF:  You are not able to take your medicines as directed.  Your symptoms get worse.  You relapse. SEEK IMMEDIATE MEDICAL CARE IF:  You have serious thoughts about hurting yourself or others.  You have a seizure.  You lose consciousness. Document Released: 04/03/2003 Document Revised: 08/15/2013 Document Reviewed: 04/13/2013 [facility] Patient Information 2015 [place], [place]. This information is not intended to replace advice given to you by your health care provider. Make sure you discuss any questions you have with your health care provider.  Behavioral Health Resources in the [facility]  Intensive Outpatient Programs: [facility]      [place]. [place], [place] Both a day and evening program       [facility]     [address]        [place], [place] [address] [phone]         ADS: Alcohol [facility] [place]: [phone] or [phone] [address] [place], [place] [address] [email or web address]  Mobile Crisis Teams:                                        [facility]         Mobile Crisis Care Unit [phone]             [facility] [place] [address] Lady Gary [place] [address] [place] [phone]  Self-Help/Support Groups: [facility] [employer] of support groups [phone] (call for more info)  Narcotics Anonymous (NA) Caring Services [address] [place] - 2 meetings at this location  Residential Treatment Programs:  [place]       [place] [address], [place], [place]  [address] [place]  [address] [place], [place] [address] [phone] Admissions: 8am-3pm M-F  [facility] Substance [place]     [address] [place], [place] [address]       [phone]         [facility] [place] [address] Jadene Pierini [place], [place]  [facility] [address] [place], [place]  [facility] - Intensive Outpatient      [address] [place] [address]     [place], [place]         [facility]     [place], [place] or [phone]  Residential Treatment Services (RTS)  [place], [place]  [facility] [address]                                               [place]  [facility] Resources: [phone]               General Therapy                                                Domenic Schwab, PhD        [address] [place],  [address]  [place]   [address] [place], [place] [address] [phone]  [facility] [address] [place], [place] [address] [phone] Insurance/Medicaid/sponsorship through [facility] and Families                                              [address] [place]                                        [place], [place] [address]    Therapy/tele-psych/case         [place] [address], [place]  [address]  Adolescent/group home/case management [phone]                                           Rosette Reveal PhD       General therapy       Insurance   [phone]         Dr. Adele Schilder Insurance [phone] M-F  [place] [facility]/Residential Medicaid, sponsorship [phone]

## 2014-04-20 NOTE — Telephone Encounter (Signed)
[facility] with [facility] gave her md approval, also she has noted pt need appt per md.../lmb

## 2014-04-20 NOTE — ED Notes (Signed)
During assessment in triage, pt demonstrates periords of restlessness, then restlessness subsides and pt looks as if he fell asleep. Pt very easily arousable to voice. Speech is clear, and answers questions appropriately

## 2014-05-01 ENCOUNTER — Other Ambulatory Visit: Payer: Self-pay | Admitting: Internal Medicine

## 2014-05-04 ENCOUNTER — Other Ambulatory Visit: Payer: Self-pay | Admitting: Internal Medicine

## 2014-05-04 NOTE — Telephone Encounter (Signed)
#  15 This will not be filled again w/o OV

## 2014-05-04 NOTE — Telephone Encounter (Signed)
Alprazolam has been called to [facility] [place]

## 2014-05-11 ENCOUNTER — Ambulatory Visit: Payer: Managed Care, Other (non HMO) | Admitting: Internal Medicine

## 2014-05-11 DIAGNOSIS — R4689 Other symptoms and signs involving appearance and behavior: Secondary | ICD-10-CM | POA: Insufficient documentation

## 2014-05-19 ENCOUNTER — Encounter: Payer: Managed Care, Other (non HMO) | Admitting: Internal Medicine

## 2014-05-19 ENCOUNTER — Other Ambulatory Visit: Payer: Self-pay | Admitting: Internal Medicine

## 2014-05-19 NOTE — Telephone Encounter (Signed)
Declined ; he has cancelled last 2 appointments.   I am transitioning to retirement from primary care. 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 You should transfer your primary care to another physician.

## 2014-05-23 ENCOUNTER — Encounter: Payer: Self-pay | Admitting: Internal Medicine

## 2014-05-23 ENCOUNTER — Ambulatory Visit (INDEPENDENT_AMBULATORY_CARE_PROVIDER_SITE_OTHER): Payer: Managed Care, Other (non HMO) | Admitting: Internal Medicine

## 2014-05-23 VITALS — BP 142/96 | HR 83 | Temp 97.8°F | Resp 17 | Ht 68.0 in | Wt 196.0 lb

## 2014-05-23 DIAGNOSIS — F17200 Nicotine dependence, unspecified, uncomplicated: Secondary | ICD-10-CM

## 2014-05-23 DIAGNOSIS — G4700 Insomnia, unspecified: Secondary | ICD-10-CM

## 2014-05-23 DIAGNOSIS — Z86010 Personal history of colonic polyps: Secondary | ICD-10-CM

## 2014-05-23 DIAGNOSIS — F17210 Nicotine dependence, cigarettes, uncomplicated: Secondary | ICD-10-CM

## 2014-05-23 DIAGNOSIS — Z0000 Encounter for general adult medical examination without abnormal findings: Secondary | ICD-10-CM

## 2014-05-23 DIAGNOSIS — Z0189 Encounter for other specified special examinations: Secondary | ICD-10-CM

## 2014-05-23 DIAGNOSIS — R1314 Dysphagia, pharyngoesophageal phase: Secondary | ICD-10-CM

## 2014-05-23 MED ORDER — NICOTINE 21 MG/24HR TD PT24: 21.0000 mg | MEDICATED_PATCH | Freq: Every day | TRANSDERMAL | Status: DC

## 2014-05-23 MED ORDER — ALPRAZOLAM 1 MG PO TABS: ORAL_TABLET | ORAL | Status: DC

## 2014-05-23 NOTE — Patient Instructions (Signed)
  Your next office appointment will be determined based upon review of your pending labs . Those instructions will be transmitted to you by mail Critical values will be called. Minimal Blood Pressure Goal= AVERAGE < 140/90;  Ideal is an AVERAGE < 135/85. This AVERAGE should be calculated from @ least 5-7 BP readings taken @ different times of day on different days of week. You should not respond to isolated BP readings , but rather the AVERAGE for that week .Please bring your  blood pressure cuff to office visits to verify that it is reliable.It  can also be checked against the blood pressure device at the pharmacy. Finger or wrist cuffs are not dependable; an arm cuff is.

## 2014-05-23 NOTE — Progress Notes (Signed)
Pre visit review using our clinic review tool, if applicable. No additional management support is needed unless otherwise documented below in the visit note. 

## 2014-05-23 NOTE — Progress Notes (Signed)
Subjective:    Patient ID: Peter Vang, male    DOB: [birth year], 49 y.o.   MRN: [id number]  HPI  He is here for a physical;acute issues include chronic sleep dysfunction with component of restless leg syndrome.   Requip of no benefit.Max restful sleep per week is "10 hours". Prior sleep evaluation simply was interview and exam; no sleep study performed."I was told to take hot bath before bedtime & was charged > $700".  A  Psychiatrist had prescribed Seroquel which was also no benefit.    He has been compliant with his blood pressure medication and statin. He states he's motivated to improve his lifestyle and address health risks preventatively ,including stopping smoking.He's presently 1 pack per day.  He is on low-sodium modified heart healthy diet. He is on no regular exercise program. He is not monitoring blood pressure at home.    Review of Systems  Active symptoms include dysphagia on average once a week. In 2009 he was found to have an erosion of the distal esophagus on upper endoscopy.  He had a hyperplastic polyp at colonoscopy; follow-up was recommended in 2015; to date this has not been done.He is not having active GI symptoms other than dysphagia.    Objective:   Physical Exam  Gen.: Adequately nourished in appearance. Alert, appropriate and cooperative throughout exam. Weathered facies. Head: Normocephalic without obvious abnormalities  Eyes: No corneal or conjunctival inflammation noted. Pupils equal round reactive to light and accommodation. Extraocular motion intact.  Ears: External  ear exam reveals no significant lesions or deformities. Canals clear .TMs normal. Hearing is grossly normal bilaterally. Nose: External nasal exam reveals no deformity or inflammation. Nasal mucosa are pink and moist. No lesions or exudates noted.   Mouth: Very hoarse.Oral mucosa and oropharynx reveal no lesions or exudates. Teeth in good repair. Neck: No deformities, masses, or  tenderness noted. Range of motion & Thyroid normal. Lungs: Normal respiratory effort; chest expands symmetrically. Lungs are clear to auscultation without rales, wheezes, or increased work of breathing but breath sounds somewhat decreased. Heart: Normal rate and rhythm. Normal S1 and S2. No gallop, click, or rub. No murmur. Abdomen: Bowel sounds normal; abdomen soft and nontender. No masses, organomegaly or hernias noted. Genitalia: Genitalia normal except for left varices. Prostate is asymmetrically enlarged; L lobe > R w/o nodularity or induration                         Musculoskeletal/extremities: No deformity or scoliosis noted of  the thoracic or lumbar spine.  No clubbing, cyanosis, edema, or significant extremity  deformity noted.  Range of motion decreased L knee. Fusiform changes present.Connye Burkitt & strength normal. Hand joints normal Fingers reveal evidence of manual labor. Crepitus of R knee.  Able to lie down & sit up w/o help.  Negative SLR bilaterally Vascular: Carotid, radial artery, dorsalis pedis and  posterior tibial pulses are full and equal. No bruits present. Neurologic: Alert and oriented x3. Deep tendon reflexes normal except 1/2 + @ L knee.  Gait normal    Skin: Intact without suspicious lesions or rashes. Lymph: No cervical, axillary, or inguinal lymphadenopathy present. Psych: Mood and affect are normal. Normally interactive  . Appears genuinely motivated to address current health risks.  Assessment & Plan:  #1 comprehensive physical exam; no acute findings other than prostate asymmetry #2 sleep disorder #3 dysphagia  Plan: see Orders  & Recommendations

## 2014-05-24 ENCOUNTER — Telehealth: Payer: Self-pay | Admitting: Internal Medicine

## 2014-05-24 DIAGNOSIS — R1314 Dysphagia, pharyngoesophageal phase: Secondary | ICD-10-CM | POA: Insufficient documentation

## 2014-05-24 NOTE — Telephone Encounter (Signed)
emmi emailed °

## 2014-05-31 ENCOUNTER — Encounter: Payer: Self-pay | Admitting: Internal Medicine

## 2014-06-07 ENCOUNTER — Telehealth: Payer: Self-pay | Admitting: *Deleted

## 2014-06-07 NOTE — Telephone Encounter (Signed)
[place] Night - Client TELEPHONE ADVICE RECORD [facility] Patient Name: Peter Vang Gender: Male DOB: Apr 05, 1966 Age: 49 Y 3 M 10 D Return Phone Number: [phone] (Primary) Address: City/State/Zip: [place] Client [facility] [place] Night - Client Client Site [place] - Night Physician [place], [place] Type Call Call Type Triage / Clinical Caller Name Virgil Relationship To Patient Spouse Return Phone Number [phone] (Primary) Chief Complaint WHEEZING Initial Comment Caller states her husband is wheezing and has some shortness of breath. Has copd and needs inhaler PreDisposition Did not know what to do Nurse Assessment Nurse: Harlow Mares, RN, Suanne Marker Date/Time (Eastern Time): 06/03/2014 6:30:17 PM Confirm and document reason for call. If symptomatic, describe symptoms. ---Caller states her husband is wheezing and has some shortness of breath. Has copd and needs inhaler. He only uses a rescue inhaler only when sick. Tried to go to [facility], but they were closed. Reports that he has bad cough and congested, cough is productive= yellow and green. Denies fever. Is taking ibuprofen. Symptoms began on Thursday afternoon. Saw MD this week before the symptoms began. Has the patient traveled out of the country within the last 30 days? ---No Does the patient require triage? ---Yes Related visit to physician within the last 2 weeks? ---No Does the PT have any chronic conditions? (i.e. diabetes, asthma, etc.) ---Yes List chronic conditions. ---hypertension; COPD Guidelines Guideline Title Affirmed Question Affirmed Notes Nurse Date/Time (Eastern Time) Breathing Difficulty [1] Longstanding difficulty breathing (e.g., CHF, COPD, emphysema) AND [2] WORSE than normal Ferd Glassing 06/03/2014 6:33:58 PM Disp. Time Eilene Ghazi Time) Disposition Final User 06/03/2014 6:23:53 PM Send to Urgent Dorthy Cooler, Meaghan PLEASE  NOTE: All timestamps contained within this report are represented as [place] Standard Time. CONFIDENTIALTY NOTICE: This fax transmission is intended only for the addressee. It contains information that is legally privileged, confidential or otherwise protected from use or disclosure. If you are not the intended recipient, you are strictly prohibited from reviewing, disclosing, copying using or disseminating any of this information or taking any action in reliance on or regarding this information. If you have received this fax in error, please notify us immediately by telephone so that we can arrange for its return to [place]. Phone: [phone], Toll-Free: [phone], Fax: [phone] Page: 2 of 2 Call Id: [id number] 06/03/2014 6:38:16 PM See Physician within 4 Hours (or PCP triage) Yes Harlow Mares, RN, Rosalyn Charters Understands: Yes Disagree/Comply: Comply Care Advice Given Per Guideline SEE PHYSICIAN WITHIN 4 HOURS (or PCP triage): * IF NO PCP TRIAGE: You need to be seen. Go to _______________ (ED/UCC or office if it will be open) within the next 3 or 4 hours. Go sooner if you become worse. CALL BACK IF: * You become worse. CARE ADVICE given per Breathing Difficulty (Adult) guideline. After Care Instructions Given Call Event Type User Date / Time Description

## 2014-06-12 ENCOUNTER — Ambulatory Visit (INDEPENDENT_AMBULATORY_CARE_PROVIDER_SITE_OTHER): Payer: Managed Care, Other (non HMO) | Admitting: Neurology

## 2014-06-12 ENCOUNTER — Encounter: Payer: Self-pay | Admitting: Neurology

## 2014-06-12 VITALS — BP 141/89 | HR 81 | Resp 16 | Ht 68.0 in | Wt 194.2 lb

## 2014-06-12 DIAGNOSIS — J441 Chronic obstructive pulmonary disease with (acute) exacerbation: Secondary | ICD-10-CM

## 2014-06-12 DIAGNOSIS — G2581 Restless legs syndrome: Secondary | ICD-10-CM

## 2014-06-12 DIAGNOSIS — R0683 Snoring: Secondary | ICD-10-CM | POA: Diagnosis not present

## 2014-06-12 DIAGNOSIS — G253 Myoclonus: Secondary | ICD-10-CM | POA: Diagnosis not present

## 2014-06-12 DIAGNOSIS — G4701 Insomnia due to medical condition: Secondary | ICD-10-CM

## 2014-06-12 MED ORDER — CLONAZEPAM 1 MG PO TABS: 1.0000 mg | ORAL_TABLET | Freq: Every day | ORAL | Status: DC

## 2014-06-12 MED ORDER — CITALOPRAM HYDROBROMIDE 20 MG PO TABS: ORAL_TABLET | ORAL | Status: DC
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
## 2014-06-13 LAB — IRON AND TIBC
Iron Saturation: 24 % (ref 15–55)
Iron: 71 ug/dL (ref 38–169)
Total Iron Binding Capacity: 302 ug/dL (ref 250–450)
UIBC: 231 ug/dL (ref 111–343)

## 2014-06-13 LAB — FERRITIN: Ferritin: 116 ng/mL (ref 30–400)

## 2014-06-15 ENCOUNTER — Ambulatory Visit (INDEPENDENT_AMBULATORY_CARE_PROVIDER_SITE_OTHER): Payer: Managed Care, Other (non HMO) | Admitting: Neurology

## 2014-06-15 DIAGNOSIS — G2581 Restless legs syndrome: Secondary | ICD-10-CM

## 2014-06-15 DIAGNOSIS — Z0289 Encounter for other administrative examinations: Secondary | ICD-10-CM

## 2014-06-15 DIAGNOSIS — G253 Myoclonus: Secondary | ICD-10-CM

## 2014-06-15 DIAGNOSIS — G4701 Insomnia due to medical condition: Secondary | ICD-10-CM

## 2014-06-15 DIAGNOSIS — G4700 Insomnia, unspecified: Secondary | ICD-10-CM

## 2014-06-15 DIAGNOSIS — G4730 Sleep apnea, unspecified: Secondary | ICD-10-CM

## 2014-06-15 DIAGNOSIS — R0683 Snoring: Secondary | ICD-10-CM

## 2014-06-15 DIAGNOSIS — J441 Chronic obstructive pulmonary disease with (acute) exacerbation: Secondary | ICD-10-CM

## 2014-06-15 NOTE — Sleep Study (Signed)
Please see the scanned sleep study interpretation located in the Procedure tab within the Chart Review section. 

## 2014-06-16 ENCOUNTER — Other Ambulatory Visit: Payer: Self-pay | Admitting: Internal Medicine

## 2014-06-16 NOTE — Telephone Encounter (Signed)
Dr Beacher May ,the Sleep expert feels Clonopin is more effective for restless legs & muscle spasm & safer than Xanax. Unless she wants to continue it; it should be stopped in place of Clonopin

## 2014-06-17 ENCOUNTER — Other Ambulatory Visit: Payer: Self-pay | Admitting: Internal Medicine

## 2014-06-19 NOTE — Telephone Encounter (Signed)
Do not see this on patient's current medication list. Please advise.

## 2014-06-19 NOTE — Telephone Encounter (Signed)
OK X 3 mos 

## 2014-06-23 NOTE — Progress Notes (Signed)
Quick Note:  I called pt and relayed the results of normal labs as above. Pt verbalized understanding. He is asking for HST results. Will forward message to Dr. Brett Fairy and Shanon Brow in sleep lab. ______

## 2014-07-08 ENCOUNTER — Encounter: Payer: Self-pay | Admitting: Neurology

## 2014-07-08 ENCOUNTER — Other Ambulatory Visit: Payer: Self-pay | Admitting: Neurology

## 2014-07-08 ENCOUNTER — Encounter: Payer: Self-pay | Admitting: Internal Medicine

## 2014-07-08 DIAGNOSIS — G4733 Obstructive sleep apnea (adult) (pediatric): Secondary | ICD-10-CM

## 2014-07-08 DIAGNOSIS — R0902 Hypoxemia: Secondary | ICD-10-CM

## 2014-07-10 ENCOUNTER — Telehealth: Payer: Self-pay | Admitting: Neurology

## 2014-07-10 MED ORDER — CLONAZEPAM 1 MG PO TABS: 1.0000 mg | ORAL_TABLET | Freq: Two times a day (BID) | ORAL | Status: DC | PRN

## 2014-07-10 NOTE — Telephone Encounter (Signed)
i have increased the klonopin to 2 tabs of 1 mg as needed.  I asked Peter Vang to call him , that the prescription was written , 60 tabs and 2 refills. CD

## 2014-07-10 NOTE — Telephone Encounter (Signed)
Spoke to pt and relayed that prescription was sent to our off site pharmacy tech.  She did fax and hopefully this is ready for you when you arrive at [place] [facility] [place]. Pt was heading out to town tomorrow and wanted to pick up today if could.  I relayed the dosing of Clonazepam 1 mg po bid prn.

## 2014-07-10 NOTE — Telephone Encounter (Signed)
Patient stated Rx clonazePAM (KLONOPIN) 1 MG tablet needs to read 2 tabs a night as instructed by Dr. Brett Fairy on 06/12/14 ov.  Please call when ready for pick up.Marland KitchenMarland KitchenPatient works out of town and leave tomorrow am.

## 2014-07-10 NOTE — Telephone Encounter (Signed)
OV note from 02/29 says: Klonopin 1mg  q hs  Patient is requesting a new Rx with instructions of 2 tabs nightly.  Please advise.  Thank you.

## 2014-07-10 NOTE — Telephone Encounter (Signed)
Peter Vang, this patient says someone called him in regard to this medication.  If it was you can you call him back please?  Thanks!

## 2014-07-10 NOTE — Telephone Encounter (Signed)
I have not called the patient.  He was asking for a dose change on Rx.  This is pending provider review.

## 2014-07-18 ENCOUNTER — Ambulatory Visit (INDEPENDENT_AMBULATORY_CARE_PROVIDER_SITE_OTHER): Payer: Managed Care, Other (non HMO) | Admitting: Neurology

## 2014-07-18 VITALS — BP 135/89

## 2014-07-18 DIAGNOSIS — G4733 Obstructive sleep apnea (adult) (pediatric): Secondary | ICD-10-CM | POA: Diagnosis not present

## 2014-07-18 DIAGNOSIS — R0902 Hypoxemia: Secondary | ICD-10-CM

## 2014-07-19 NOTE — Sleep Study (Signed)
Please see the scanned sleep study interpretation located in the Procedure tab within the Chart Review section. 

## 2014-07-21 ENCOUNTER — Telehealth: Payer: Self-pay | Admitting: *Deleted

## 2014-07-21 DIAGNOSIS — G2581 Restless legs syndrome: Secondary | ICD-10-CM

## 2014-07-21 MED ORDER — GABAPENTIN 100 MG PO CAPS: ORAL_CAPSULE | ORAL | Status: DC

## 2014-07-21 NOTE — Telephone Encounter (Signed)
Marcie Bal B sent message about pt calling.  Not sleeping.  Needs sleep aide (wants to be knocked out) his words.  Clonazepam has not worked.   He has been on other sleep aides that have made his sx worse.  See ofv note.  Has only slept for 14hours this last week.  Forwarded to Dr. Rexene Alberts to call pt and address.

## 2014-07-21 NOTE — Telephone Encounter (Signed)
I talked to the patient. He feels that clonazepam even with 2 pills does not help. In the past for insomnia he has tried trazodone, Benadryl, and Ambien with no improvement or worsening of his RLS symptoms reported. He has tried Requip in the past. He has not tried Mirapex which is a consideration. At this juncture, I suggested gabapentin 100 mg strength one pill at bedtime for 1 night with increase each night to up to 3 pills for now. We can increase this further if needed. I talked to him about potential side effects and low side effect profile typically low interaction profile typically with this medication. He may have tried it in the past but does not remember if it worked or what it was for at the time. Prescription will be sent to pharmacy on file. Patient demonstrated understanding and agreement.

## 2014-07-25 ENCOUNTER — Telehealth: Payer: Self-pay | Admitting: *Deleted

## 2014-07-25 NOTE — Telephone Encounter (Signed)
Dr. Carlean Purl,  This patient had colonoscopy 05/2007 with hyperplastic polyp. Note stated recall colon 10 years if hyperplastic. Recall letter was sent to patient 03/2014. Does this patient need recall now or 05/2017?  Thanks, Olivia Mackie

## 2014-07-25 NOTE — Telephone Encounter (Signed)
Yes for now - I am recommending it now based upon age 49 and the prep results from last exam

## 2014-07-28 ENCOUNTER — Ambulatory Visit (AMBULATORY_SURGERY_CENTER): Payer: Self-pay | Admitting: *Deleted

## 2014-07-28 VITALS — Ht 68.0 in | Wt 191.0 lb

## 2014-07-28 DIAGNOSIS — Z1211 Encounter for screening for malignant neoplasm of colon: Secondary | ICD-10-CM

## 2014-07-28 NOTE — Progress Notes (Signed)
No egg or soy allergy No diet pills No home 02 use No issues with past sedation

## 2014-08-01 ENCOUNTER — Encounter: Payer: Self-pay | Admitting: Internal Medicine

## 2014-08-07 ENCOUNTER — Telehealth: Payer: Self-pay | Admitting: *Deleted

## 2014-08-07 ENCOUNTER — Encounter: Payer: Self-pay | Admitting: Neurology

## 2014-08-07 DIAGNOSIS — R0902 Hypoxemia: Secondary | ICD-10-CM

## 2014-08-07 DIAGNOSIS — G4733 Obstructive sleep apnea (adult) (pediatric): Secondary | ICD-10-CM | POA: Insufficient documentation

## 2014-08-07 DIAGNOSIS — G2581 Restless legs syndrome: Secondary | ICD-10-CM

## 2014-08-07 NOTE — Telephone Encounter (Signed)
Spoke with Shanon Brow and advised that per Dr. Felecia Shelling, he recommends cpap therapy at a pressure of 7cm H2O to treat osa.  He verbalized understanding of same, sts. would like to proceed with appt. to be set up with cpap machine.  He would also like to discuss another sleep med--sts. Gabapentin is not helping enough./fim

## 2014-08-08 ENCOUNTER — Encounter: Payer: Self-pay | Admitting: *Deleted

## 2014-08-08 ENCOUNTER — Other Ambulatory Visit: Payer: Self-pay | Admitting: Neurology

## 2014-08-08 DIAGNOSIS — G4733 Obstructive sleep apnea (adult) (pediatric): Secondary | ICD-10-CM

## 2014-08-08 NOTE — Telephone Encounter (Signed)
I called pt.   He is still having problems with insomnia.  The gabapentin 300mg  po qhs has not really helped with sleep, although noted that leg cramping has helped sporadically.  Benadryl, trazadone, ambien, clonazepam  does not work, makes leg cramps worse.  He would like to try something else.  Please call tomorrow.   Dr. Brett Fairy out ill today.  He was ok with this.   Sleep lab to call about cpap.

## 2014-08-08 NOTE — Telephone Encounter (Signed)
Patient was contacted and referred to [facility] for CPAP set up.  Dr. Unice Cobble was routed a copy of the report.  The patient gave verbal permission to mail a copy of his test results.   Patient instructed to contact our office 6-8 weeks post set up to schedule a follow up appointment.

## 2014-08-10 ENCOUNTER — Encounter: Payer: Self-pay | Admitting: Internal Medicine

## 2014-08-10 ENCOUNTER — Ambulatory Visit (AMBULATORY_SURGERY_CENTER): Payer: Managed Care, Other (non HMO) | Admitting: Internal Medicine

## 2014-08-10 VITALS — BP 161/108 | HR 78 | Temp 97.7°F | Resp 23 | Ht 68.0 in | Wt 191.0 lb

## 2014-08-10 DIAGNOSIS — D122 Benign neoplasm of ascending colon: Secondary | ICD-10-CM

## 2014-08-10 DIAGNOSIS — Z1211 Encounter for screening for malignant neoplasm of colon: Secondary | ICD-10-CM | POA: Diagnosis present

## 2014-08-10 DIAGNOSIS — G2581 Restless legs syndrome: Secondary | ICD-10-CM

## 2014-08-10 MED ORDER — ALPRAZOLAM 1 MG PO TABS: 1.0000 mg | ORAL_TABLET | Freq: Every evening | ORAL | Status: DC | PRN

## 2014-08-10 MED ORDER — SODIUM CHLORIDE 0.9 % IV SOLN: 500.0000 mL | INTRAVENOUS | Status: DC

## 2014-08-10 NOTE — Progress Notes (Signed)
Called to room to assist during endoscopic procedure.  Patient ID and intended procedure confirmed with present staff. Received instructions for my participation in the procedure from the performing physician.  

## 2014-08-10 NOTE — Progress Notes (Signed)
Wt Readings from Last 3 Encounters:  08/10/14 191 lb (86.637 kg)  07/28/14 191 lb (86.637 kg)  06/12/14 194 lb 3.2 oz (88.089 kg)   Temp Readings from Last 3 Encounters:  08/10/14 97.7 F (36.5 C) Oral  05/23/14 97.8 F (36.6 C) Oral  04/20/14 97.7 F (36.5 C) Oral   BP Readings from Last 3 Encounters:  08/10/14 161/108  07/19/14 135/89  06/12/14 141/89   Pulse Readings from Last 3 Encounters:  08/10/14 78  06/12/14 81  05/23/14 83   BP up at dc He was in some distress with restless legs, otherwise ok. To f/u PCP

## 2014-08-10 NOTE — Patient Instructions (Addendum)
I found and removed one small polyp that looks benign.  You also have a condition called diverticulosis - common and not usually a problem. Please read the handout provided.  I will let you know pathology results and when to have another routine colonoscopy by mail.  I appreciate the opportunity to care for you.  Gatha Mayer, MD, FACG   YOU HAD AN ENDOSCOPIC PROCEDURE TODAY AT [facility]:   Refer to the procedure report that was given to you for any specific questions about what was found during the examination.  If the procedure report does not answer your questions, please call your gastroenterologist to clarify.  If you requested that your care partner not be given the details of your procedure findings, then the procedure report has been included in a sealed envelope for you to review at your convenience later.  YOU SHOULD EXPECT: Some feelings of bloating in the abdomen. Passage of more gas than usual.  Walking can help get rid of the air that was put into your GI tract during the procedure and reduce the bloating. If you had a lower endoscopy (such as a colonoscopy or flexible sigmoidoscopy) you may notice spotting of blood in your stool or on the toilet paper. If you underwent a bowel prep for your procedure, you may not have a normal bowel movement for a few days.  Please Note:  You might notice some irritation and congestion in your nose or some drainage.  This is from the oxygen used during your procedure.  There is no need for concern and it should clear up in a day or so.  SYMPTOMS TO REPORT IMMEDIATELY:   Following lower endoscopy (colonoscopy or flexible sigmoidoscopy):  Excessive amounts of blood in the stool  Significant tenderness or worsening of abdominal pains  Swelling of the abdomen that is new, acute  Fever of 100F or higher    For urgent or emergent issues, a gastroenterologist can be reached at any hour by calling [phone]  [phone].   DIET: Your first meal following the procedure should be a small meal and then it is ok to progress to your normal diet. Heavy or fried foods are harder to digest and may make you feel nauseous or bloated.  Likewise, meals heavy in dairy and vegetables can increase bloating.  Drink plenty of fluids but you should avoid alcoholic beverages for 24 hours.  ACTIVITY:  You should plan to take it easy for the rest of today and you should NOT DRIVE or use heavy machinery until tomorrow (because of the sedation medicines used during the test).    FOLLOW UP: Our staff will call the number listed on your records the next business day following your procedure to check on you and address any questions or concerns that you may have regarding the information given to you following your procedure. If we do not reach you, we will leave a message.  However, if you are feeling well and you are not experiencing any problems, there is no need to return our call.  We will assume that you have returned to your regular daily activities without incident.  If any biopsies were taken you will be contacted by phone or by letter within the next 1-3 weeks.  Please call us at [phone] if you have not heard about the biopsies in 3 weeks.    SIGNATURES/CONFIDENTIALITY: You and/or your care partner have signed paperwork which will be entered into your electronic  medical record.  These signatures attest to the fact that that the information above on your After Visit Summary has been reviewed and is understood.  Full responsibility of the confidentiality of this discharge information lies with you and/or your care-partner.    INFORMATION ON POLYPS GIVEN TO YOU TODAY  RX FOR XANAX GIVEN TO YOU BY DR Carlean Purl

## 2014-08-10 NOTE — Op Note (Signed)
[facility]  [address] [place], [id number]   COLONOSCOPY PROCEDURE REPORT  PATIENT: Peter, Vang  MR#: [id number] BIRTHDATE: 1966-03-03 , 48  yrs. old GENDER: male ENDOSCOPIST: Gatha Mayer, MD, [facility] PROCEDURE DATE:  08/10/2014 PROCEDURE:   Colonoscopy, screening and Colonoscopy with snare polypectomy First Screening Colonoscopy - Avg.  risk and is 50 yrs.  old or older Yes.  Prior Negative Screening - Now for repeat screening. N/A  History of Adenoma - Now for follow-up colonoscopy & has been > or = to 3 yrs.  N/A ASA CLASS:   Class III INDICATIONS:Screening for colonic neoplasia and Colorectal Neoplasm Risk Assessment for this procedure is average risk. MEDICATIONS: Propofol 200 mg IV and Monitored anesthesia care  DESCRIPTION OF PROCEDURE:   After the risks benefits and alternatives of the procedure were thoroughly explained, informed consent was obtained.  The digital rectal exam revealed no abnormalities of the rectum, revealed no prostatic nodules, and revealed the prostate was not enlarged.   The LB [id number]-H190 [id number] endoscope was introduced through the anus and advanced to the cecum, which was identified by both the appendix and ileocecal valve. No adverse events experienced.   The quality of the prep was good.  (MiraLax was used)  The instrument was then slowly withdrawn as the colon was fully examined.      COLON FINDINGS: A sessile polyp measuring 5 mm in size was found in the ascending colon.  A polypectomy was performed with a cold snare.  The resection was complete, the polyp tissue was completely retrieved and sent to histology.   The examination was otherwise normal.  Retroflexed views revealed no abnormalities. The time to cecum = 1.8 Withdrawal time = 8.7   The scope was withdrawn and the procedure completed. COMPLICATIONS: There were no immediate complications.  ENDOSCOPIC IMPRESSION: 1.   Sessile polyp was found in the  ascending colon; polypectomy was performed with a cold snare 2.   The examination was otherwise normal - good prep  RECOMMENDATIONS: Timing of repeat colonoscopy will be determined by pathology findings.  eSigned:  Gatha Mayer, MD, [facility] 08/10/2014 9:30 AM   cc: The Patient and Unice Cobble, MD

## 2014-08-10 NOTE — Assessment & Plan Note (Signed)
Having terrible exacerbation before and after colonoscopy. Says alprazolam only thing that has helped. Rx 1 mg # 10 no refill

## 2014-08-10 NOTE — Progress Notes (Signed)
Report to PACU, RN, vss, BBS= Clear.  

## 2014-08-11 ENCOUNTER — Telehealth: Payer: Self-pay | Admitting: *Deleted

## 2014-08-11 MED ORDER — GABAPENTIN 100 MG PO CAPS: ORAL_CAPSULE | ORAL | Status: DC

## 2014-08-11 NOTE — Addendum Note (Signed)
Addended by: Larey Seat on: 08/11/2014 01:22 PM   Modules accepted: Orders

## 2014-08-11 NOTE — Telephone Encounter (Signed)
Please call pt re: recc on other medication for insomnia.

## 2014-08-11 NOTE — Telephone Encounter (Signed)
First I would like to thank the team at [facility] for taking care of these messages in my absence. I suggest a higher dose of Neurontin since we have had at least a partial response to this medication area at this patient has failed or has been failed by much stronger more potent sleepiness causing medications and remained insomniac. Once the CPAP treatment is ongoing I think he needs to be referred to a psychiatrist to address insomnia. It may not be organic.

## 2014-08-11 NOTE — Telephone Encounter (Signed)
No answer, left message to call if questions or concerns. 

## 2014-08-13 ENCOUNTER — Other Ambulatory Visit: Payer: Self-pay | Admitting: Neurology

## 2014-08-14 MED ORDER — ALPRAZOLAM 2 MG PO TABS: 2.0000 mg | ORAL_TABLET | Freq: Every evening | ORAL | Status: DC | PRN

## 2014-08-14 NOTE — Telephone Encounter (Signed)
Xanax prescription printed, after 17.00 hours , will be faxed tomorrow.

## 2014-08-14 NOTE — Telephone Encounter (Signed)
I called and spoke to pt about the recommendations of gabapentin taking 300mg  po qhs but also taking 200mg  po at dinner for RLS. This does not assist in him in sleep.  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  Pt stated had not heard from DME/[facility] about his cpap.  He stated would call them again.  I called and and spoke to  [facility] about his cpap, they did call and speak to wife and she did not set up cpap arrival as wanted to speak to doctor first.  I told [facility] to call and speak to pt.  He is frustrated and would really like to speak to Dr. Brett Fairy.  Maybe go back on xanax?  I told him I would relay to Dr. Brett Fairy that he would appreciate a call from her. [phone]

## 2014-08-14 NOTE — Addendum Note (Signed)
Addended by: Larey Seat on: 08/14/2014 05:18 PM   Modules accepted: Orders

## 2014-08-14 NOTE — Telephone Encounter (Signed)
Per phone note

## 2014-08-14 NOTE — Telephone Encounter (Signed)
I spoke to Peter Vang today on the phone he would like to 10 days worth of Xanax until his CPAP will arrive he would also like to be contacted [facility] went to pick up the machine and if he can get some desensitization training at the same time through the DME. I will order the Xanax now and  I want to make sure that the patient will be contacted by [facility] w this week.

## 2014-08-15 ENCOUNTER — Telehealth: Payer: Self-pay

## 2014-08-15 ENCOUNTER — Telehealth: Payer: Self-pay | Admitting: Neurology

## 2014-08-15 NOTE — Telephone Encounter (Signed)
Ed Blalock .[employer]

## 2014-08-15 NOTE — Telephone Encounter (Signed)
OK Messgae received

## 2014-08-15 NOTE — Telephone Encounter (Signed)
Phone call from Howe, Software engineer at [employer] on [address]. He states patient got a prescription called in last night for Xanax 2mg  #10 instructions to take a half tablet from Dr Asencion Partridge Dohmeier. Then another script for same strength and qty #10 instructions to take a whole tablet this morning.   On 08/10/14 a script was filled that was written by Dr Silvano Rusk for Xanax 1 mg #10 to take daily at bedtime as needed.   Matt stating he called the pharmacy about 5 times yesterday and also called Dr Dohmeier's home multiple times to get the prescription.

## 2014-08-15 NOTE — Telephone Encounter (Signed)
Matt from [employer] called wanting to clarify the script for [employer]. Please call and advice # [phone]

## 2014-08-15 NOTE — Telephone Encounter (Signed)
I called back.  Spoke with [facility].  Says they filled Rx as prescribed by Dr Brett Fairy.  Indicates she called in the Rx after hours, and nothing further is needed at this time.  Asked that we disregard the call.

## 2014-08-16 NOTE — Telephone Encounter (Signed)
Will no longer provide medication of schedule for this patient.  I was not inclined to prescribe , but concerned that the patient may be in withdrawal. Letter to patient , C Peter Vang. MD

## 2014-08-17 ENCOUNTER — Encounter: Payer: Self-pay | Admitting: Neurology

## 2014-08-17 NOTE — Progress Notes (Signed)
Quick Note:  On 08-08-14 is noted that [facility], from the sleep lab did notify pt re: sleep results and need for cpap. [facility] to contact. ______

## 2014-08-18 ENCOUNTER — Telehealth: Payer: Self-pay | Admitting: *Deleted

## 2014-08-18 NOTE — Telephone Encounter (Signed)
[place] Night - Client TELEPHONE ADVICE RECORD [facility] Patient Name: Peter Vang Gender: Male DOB: 01-Dec-1965 Age: 49 Y 47 M 23 D Return Phone Number: [phone] (Primary) Address: City/State/Zip: [place] Client [facility] Night - Client Client Site [place] - Night Physician [place], [place] Type Call Call Type Triage / [place] Name [facility] Relationship To Patient Provider Return Phone Number [phone] (Primary) Chief Complaint Prescription Refill or Medication Request (non symptomatic) Initial Comment caller is Warner Mccreedy with [facility] - they have a patient that states he has had a nurse call in a xanax rx - has called there several times regarding this. Then the pharmacist said they received a call from a "doctor" on their recorded line - leaving a rx for this patient - no other info other than it was to be for 2 mg xanax - pharmacist thinks this sounds odd - the caller did not leave name, npi or dea number or refills or directives for rx. CB# [phone] Nurse Assessment Nurse: Thad Ranger RN, Langley Gauss Date/Time (Eastern Time): 08/14/2014 8:39:51 PM Confirm and document reason for call. If symptomatic, describe symptoms. ---Warner Mccreedy, pharmacist reports the Rx for Xanax called in is suspicious as the person who called in the Rx just left a msg stating Xanax 2mg  without any inst, DEA, NPI number etc. States he then called another MD that the pt is seeing and thinks he has this issue taken care of and the Rx is legitimate, but does not know if Dr Linna Darner knows that the pt is seeing another MD for Xanax. Has the patient traveled out of the country within the last 30 days? ---Not Applicable Does the patient require triage? ---No Please document clinical information provided and list any resource used. ---Advised to call Dr Linna Darner in the am to report THE PT IS RECIEVING RX'S Gould MD  AND THERE IS A QUESTION WHETHER DR HOPPER IS AWARE OF THIS THIS ISSUE. Advised I will send notice to [facility] as well. Guidelines Guideline Title Affirmed Question Affirmed Notes Nurse Date/Time (Eastern Time) Disp. Time Eilene Ghazi Time) Disposition Final User 08/14/2014 8:13:16 PM Send To RN Personal Donne Anon, RN, Debra PLEASE NOTE: All timestamps contained within this report are represented as [place] Standard Time. CONFIDENTIALTY NOTICE: This fax transmission is intended only for the addressee. It contains information that is legally privileged, confidential or otherwise protected from use or disclosure. If you are not the intended recipient, you are strictly prohibited from reviewing, disclosing, copying using or disseminating any of this information or taking any action in reliance on or regarding this information. If you have received this fax in error, please notify us immediately by telephone so that we can arrange for its return to [place]. Phone: [phone], Toll-Free: [phone], Fax: [phone] Page: 2 of 2 Call Id: [id number] 08/14/2014 8:45:17 PM Clinical Call Yes Thad Ranger, RN, Langley Gauss After Care Instructions Given Call Event Type User Date / Time Description

## 2014-08-22 ENCOUNTER — Encounter: Payer: Self-pay | Admitting: Internal Medicine

## 2014-08-22 DIAGNOSIS — Z86010 Personal history of colonic polyps: Secondary | ICD-10-CM

## 2014-08-22 NOTE — Progress Notes (Signed)
Quick Note:  5 mm adenoma - repeat colonoscopy 2023 ______

## 2014-08-28 ENCOUNTER — Telehealth: Payer: Self-pay | Admitting: Neurology

## 2014-08-28 NOTE — Telephone Encounter (Signed)
Patient called requesting a refill for alprazolam Duanne Moron) 2 MG tablet but was also not aware that he has been dismissed from the practice. He states that he has not received the letter from the practice. Please call and advise. Patient can be reached @ [phone]

## 2014-08-29 NOTE — Telephone Encounter (Signed)
Patient is calling you back.  Please call [phone]. Thanks!

## 2014-08-29 NOTE — Telephone Encounter (Signed)
Spoke to pt. He is upset about being dismissed. He says that he has been doubling the xanax he is prescribed by Dr. Linna Darner because the 1 mg is ineffective. He said Dr.  Brett Fairy knows that he can't sleep and that is why she should prescribe him more xanax. I informed the pt that I would forward this to the practice administrator and he would be hearing from her in a few days.

## 2014-08-29 NOTE — Telephone Encounter (Signed)
Returned pt's call, no answer, left message on mobile VM asking him to call me back.

## 2014-10-10 ENCOUNTER — Other Ambulatory Visit: Payer: Self-pay | Admitting: Internal Medicine

## 2014-10-10 NOTE — Telephone Encounter (Signed)
wellbutrin and losartan rx sent to pharm

## 2014-11-26 ENCOUNTER — Other Ambulatory Visit: Payer: Self-pay | Admitting: Internal Medicine

## 2014-11-27 ENCOUNTER — Other Ambulatory Visit: Payer: Self-pay | Admitting: Emergency Medicine

## 2014-11-27 MED ORDER — AMLODIPINE BESYLATE 5 MG PO TABS: 5.0000 mg | ORAL_TABLET | ORAL | Status: DC

## 2014-12-27 ENCOUNTER — Other Ambulatory Visit: Payer: Self-pay | Admitting: Internal Medicine

## 2015-01-08 ENCOUNTER — Other Ambulatory Visit: Payer: Self-pay | Admitting: Internal Medicine

## 2015-02-07 ENCOUNTER — Encounter: Payer: Self-pay | Admitting: Internal Medicine

## 2015-02-07 ENCOUNTER — Ambulatory Visit (INDEPENDENT_AMBULATORY_CARE_PROVIDER_SITE_OTHER): Payer: Managed Care, Other (non HMO) | Admitting: Internal Medicine

## 2015-02-07 ENCOUNTER — Ambulatory Visit (INDEPENDENT_AMBULATORY_CARE_PROVIDER_SITE_OTHER)
Admission: RE | Admit: 2015-02-07 | Discharge: 2015-02-07 | Disposition: A | Discharge status: Home / Self Care | Payer: Managed Care, Other (non HMO) | Source: Ambulatory Visit | Attending: Internal Medicine | Admitting: Internal Medicine

## 2015-02-07 VITALS — BP 128/82 | HR 86 | Temp 98.1°F | Resp 20 | Ht 68.0 in | Wt 188.4 lb

## 2015-02-07 DIAGNOSIS — M25552 Pain in left hip: Secondary | ICD-10-CM

## 2015-02-07 DIAGNOSIS — Z23 Encounter for immunization: Secondary | ICD-10-CM

## 2015-02-07 DIAGNOSIS — G2581 Restless legs syndrome: Secondary | ICD-10-CM

## 2015-02-07 DIAGNOSIS — G894 Chronic pain syndrome: Secondary | ICD-10-CM | POA: Diagnosis not present

## 2015-02-07 MED ORDER — MELOXICAM 7.5 MG PO TABS: ORAL_TABLET | ORAL | Status: AC

## 2015-02-07 MED ORDER — ALPRAZOLAM 1 MG PO TABS
1.0000 mg | ORAL_TABLET | Freq: Every evening | ORAL | Status: AC | PRN
Start: 2015-02-07 — End: ?

## 2015-02-07 NOTE — Patient Instructions (Signed)
  Your next office appointment will be determined based upon review of your pending  xrays  Those written interpretation of the lab results and instructions will be transmitted to you by mail for your records.  Critical results will be called.   Followup as needed for any active or acute issue. Please report any significant change in your symptoms. 

## 2015-02-07 NOTE — Progress Notes (Signed)
Pre visit review using our clinic review tool, if applicable. No additional management support is needed unless otherwise documented below in the visit note. 

## 2015-02-07 NOTE — Progress Notes (Signed)
   Subjective:    Patient ID: Peter Vang, male    DOB: June 18, 1965, 49 y.o.   MRN: [id number]  HPI He has chronic left lumbosacral area back pain but in the last week this is been associated with pain in the hip described as sharp. There was no trigger or injury. It became even more severe 10/25.  The back pain does radiate to the lower abdomen.  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  He's followed by pain clinic doctor in [facility] and is on Percocet .Percocet did not help the hip pain.  He denies getting controlled substances from multiple physicians. I discussed the letter received from [place] such .  Apparently MRI of the back was done last week.   Review of Systems He denies numbness, tingling, weakness in left leg. There is no associated incontinence of urine or stool Unexplained weight loss, significant dyspepsia, dysphagia, melena, rectal bleeding, or persistently small caliber stools are denied. Dysuria, pyuria, hematuria, frequency, nocturia or polyuria are denied.    Objective:   Physical Exam Pertinent or positive findings include: He appears disheveled. He has a beard and mustache. Breath sounds are decreased. He is able to lie flat and sit up without help. He has severe pain with rotation of the left hip. Straight leg raising was negative. There is marked crepitus of the knees, especially on the left.  General appearance :adequately nourished; in no distress.  Eyes: No conjunctival inflammation or scleral icterus is present.   Heart:  Normal rate and regular rhythm. S1 and S2 normal without gallop, murmur, click, rub or other extra sounds    Lungs:Chest clear to auscultation; no wheezes, rhonchi,rales ,or rubs  present.No increased work of breathing.   Abdomen: bowel sounds normal, soft and non-tender without masses, organomegaly or hernias noted.  No guarding or rebound. No flank tenderness to percussion.  Vascular : all pulses equal ; no bruits present.  Skin:Warm & dry.  Intact without suspicious lesions or rashes ; no tenting or jaundice   Lymphatic: No lymphadenopathy is noted about the head, neck, axilla.   Neuro: Strength, tone & DTRs normal.      Assessment & Plan:  #1 severe left hip pain; rule out aseptic necrosis  Plan: See orders recommendations

## 2015-02-11 ENCOUNTER — Emergency Department (HOSPITAL_BASED_OUTPATIENT_CLINIC_OR_DEPARTMENT_OTHER)
Admission: EM | Admit: 2015-02-11 | Discharge: 2015-02-11 | Disposition: A | Discharge status: Home / Self Care | Payer: Managed Care, Other (non HMO) | Attending: Emergency Medicine | Admitting: Emergency Medicine

## 2015-02-11 ENCOUNTER — Encounter (HOSPITAL_BASED_OUTPATIENT_CLINIC_OR_DEPARTMENT_OTHER): Payer: Self-pay | Admitting: Emergency Medicine

## 2015-02-11 DIAGNOSIS — Z8669 Personal history of other diseases of the nervous system and sense organs: Secondary | ICD-10-CM | POA: Insufficient documentation

## 2015-02-11 DIAGNOSIS — Z8719 Personal history of other diseases of the digestive system: Secondary | ICD-10-CM | POA: Insufficient documentation

## 2015-02-11 DIAGNOSIS — J449 Chronic obstructive pulmonary disease, unspecified: Secondary | ICD-10-CM | POA: Insufficient documentation

## 2015-02-11 DIAGNOSIS — Z86018 Personal history of other benign neoplasm: Secondary | ICD-10-CM | POA: Insufficient documentation

## 2015-02-11 DIAGNOSIS — M25552 Pain in left hip: Secondary | ICD-10-CM | POA: Diagnosis present

## 2015-02-11 DIAGNOSIS — Z86010 Personal history of colonic polyps: Secondary | ICD-10-CM | POA: Diagnosis not present

## 2015-02-11 DIAGNOSIS — Z720 Tobacco use: Secondary | ICD-10-CM | POA: Diagnosis not present

## 2015-02-11 DIAGNOSIS — Z79899 Other long term (current) drug therapy: Secondary | ICD-10-CM | POA: Insufficient documentation

## 2015-02-11 DIAGNOSIS — F419 Anxiety disorder, unspecified: Secondary | ICD-10-CM | POA: Insufficient documentation

## 2015-02-11 DIAGNOSIS — M1990 Unspecified osteoarthritis, unspecified site: Secondary | ICD-10-CM | POA: Diagnosis not present

## 2015-02-11 DIAGNOSIS — I10 Essential (primary) hypertension: Secondary | ICD-10-CM | POA: Insufficient documentation

## 2015-02-11 DIAGNOSIS — Z8639 Personal history of other endocrine, nutritional and metabolic disease: Secondary | ICD-10-CM | POA: Diagnosis not present

## 2015-02-11 LAB — URINALYSIS, ROUTINE W REFLEX MICROSCOPIC
BILIRUBIN URINE: NEGATIVE
GLUCOSE, UA: NEGATIVE mg/dL
Hgb urine dipstick: NEGATIVE
KETONES UR: NEGATIVE mg/dL
LEUKOCYTES UA: NEGATIVE
NITRITE: NEGATIVE
PH: 6 (ref 5.0–8.0)
PROTEIN: NEGATIVE mg/dL
Specific Gravity, Urine: 1.025 (ref 1.005–1.030)
Urobilinogen, UA: 1 mg/dL (ref 0.0–1.0)

## 2015-02-11 LAB — CBC WITH DIFFERENTIAL/PLATELET
Basophils Absolute: 0 10*3/uL (ref 0.0–0.1)
Basophils Relative: 1 %
EOS PCT: 4 %
Eosinophils Absolute: 0.3 10*3/uL (ref 0.0–0.7)
HCT: 39.5 % (ref 39.0–52.0)
HEMOGLOBIN: 13.4 g/dL (ref 13.0–17.0)
Lymphocytes Relative: 28 %
Lymphs Abs: 1.8 10*3/uL (ref 0.7–4.0)
MCH: 33 pg (ref 26.0–34.0)
MCHC: 33.9 g/dL (ref 30.0–36.0)
MCV: 97.3 fL (ref 78.0–100.0)
Monocytes Absolute: 0.5 10*3/uL (ref 0.1–1.0)
Monocytes Relative: 8 %
NEUTROS ABS: 3.8 10*3/uL (ref 1.7–7.7)
Neutrophils Relative %: 59 %
PLATELETS: 266 10*3/uL (ref 150–400)
RBC: 4.06 MIL/uL — AB (ref 4.22–5.81)
RDW: 11.8 % (ref 11.5–15.5)
WBC: 6.4 10*3/uL (ref 4.0–10.5)

## 2015-02-11 MED ORDER — HYDROMORPHONE HCL 1 MG/ML IJ SOLN
1.0000 mg | Freq: Once | INTRAMUSCULAR | Status: AC
  Administered 2015-02-11: 1 mg via INTRAMUSCULAR
  Filled 2015-02-11: qty 1

## 2015-02-11 NOTE — ED Notes (Signed)
Pt in c/o L hip pain "like it's out of socket" x 1 week. Pt ambulatory with no difficulty to triage. Has already seen PMD for same with x-rays done.
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
## 2015-02-11 NOTE — ED Notes (Addendum)
Patient reports left upper thigh x 1 week.  Reports he was seen by PCP for this and had an xray but was not told the results of the xray.  Patient states he currently takes 4-10mg  Percocet daily and was not given any new prescriptions at MD appt.  Patient states his pain is in his left upper thigh and radiates around the thigh.

## 2015-02-11 NOTE — Discharge Instructions (Signed)
You have been seen today for hip pain. Your labs show no abnormalities. Follow up with PCP as needed. Return to ED should symptoms worsen. Follow up with orthopedics as soon as possible.

## 2015-03-15 DEATH — deceased

## 2015-10-26 ENCOUNTER — Encounter: Payer: Self-pay | Admitting: Internal Medicine

## 2022-11-13 DEATH — deceased
